# Patient Record
Sex: Female | Born: 1945
Health system: Southern US, Community
[De-identification: ages and names within clinical notes are randomized; demographics above are authoritative.]

## PROBLEM LIST (undated history)

## (undated) DIAGNOSIS — D229 Melanocytic nevi, unspecified: Secondary | ICD-10-CM

## (undated) DIAGNOSIS — D128 Benign neoplasm of rectum: Secondary | ICD-10-CM

## (undated) DIAGNOSIS — I471 Supraventricular tachycardia, unspecified: Secondary | ICD-10-CM

## (undated) DIAGNOSIS — M8580 Other specified disorders of bone density and structure, unspecified site: Secondary | ICD-10-CM

## (undated) DIAGNOSIS — E039 Hypothyroidism, unspecified: Secondary | ICD-10-CM

## (undated) DIAGNOSIS — IMO0002 Reserved for concepts with insufficient information to code with codable children: Secondary | ICD-10-CM

## (undated) HISTORY — PX: OTHER SURGICAL HISTORY: SHX169

## (undated) HISTORY — PX: CERVICAL BIOPSY: SHX590

## (undated) HISTORY — PX: APPENDECTOMY: SHX54

## (undated) HISTORY — DX: Melanocytic nevi, unspecified: D22.9

## (undated) HISTORY — PX: TONSILLECTOMY: SUR1361

## (undated) HISTORY — PX: CHOLECYSTECTOMY: SHX55

---

## 2009-11-25 ENCOUNTER — Ambulatory Visit: Payer: Self-pay | Admitting: Internal Medicine

## 2010-06-20 ENCOUNTER — Ambulatory Visit: Payer: Self-pay | Admitting: Gastroenterology

## 2011-01-09 ENCOUNTER — Ambulatory Visit: Payer: Self-pay | Admitting: Internal Medicine

## 2012-01-18 ENCOUNTER — Ambulatory Visit: Payer: Self-pay | Admitting: Internal Medicine

## 2013-01-21 ENCOUNTER — Ambulatory Visit: Payer: Self-pay | Admitting: Internal Medicine

## 2015-05-19 ENCOUNTER — Other Ambulatory Visit: Payer: Self-pay | Admitting: Internal Medicine

## 2015-05-19 DIAGNOSIS — Z1231 Encounter for screening mammogram for malignant neoplasm of breast: Secondary | ICD-10-CM

## 2015-05-25 ENCOUNTER — Ambulatory Visit
Admission: RE | Admit: 2015-05-25 | Discharge: 2015-05-25 | Disposition: A | Discharge status: Home / Self Care | Payer: PPO | Source: Ambulatory Visit | Attending: Internal Medicine | Admitting: Internal Medicine

## 2015-05-25 DIAGNOSIS — Z1231 Encounter for screening mammogram for malignant neoplasm of breast: Secondary | ICD-10-CM | POA: Diagnosis not present

## 2015-05-28 DIAGNOSIS — Z86010 Personal history of colonic polyps: Secondary | ICD-10-CM | POA: Diagnosis not present

## 2015-06-10 DIAGNOSIS — E063 Autoimmune thyroiditis: Secondary | ICD-10-CM | POA: Diagnosis not present

## 2015-06-10 DIAGNOSIS — Z0000 Encounter for general adult medical examination without abnormal findings: Secondary | ICD-10-CM | POA: Diagnosis not present

## 2015-06-10 DIAGNOSIS — R002 Palpitations: Secondary | ICD-10-CM | POA: Diagnosis not present

## 2015-06-10 DIAGNOSIS — E038 Other specified hypothyroidism: Secondary | ICD-10-CM | POA: Diagnosis not present

## 2015-06-10 DIAGNOSIS — M8580 Other specified disorders of bone density and structure, unspecified site: Secondary | ICD-10-CM | POA: Diagnosis not present

## 2015-06-17 DIAGNOSIS — E039 Hypothyroidism, unspecified: Secondary | ICD-10-CM | POA: Diagnosis not present

## 2015-06-17 DIAGNOSIS — M8580 Other specified disorders of bone density and structure, unspecified site: Secondary | ICD-10-CM | POA: Diagnosis not present

## 2015-06-17 DIAGNOSIS — R002 Palpitations: Secondary | ICD-10-CM | POA: Diagnosis not present

## 2015-06-17 DIAGNOSIS — Z0000 Encounter for general adult medical examination without abnormal findings: Secondary | ICD-10-CM | POA: Diagnosis not present

## 2015-07-08 ENCOUNTER — Encounter: Payer: Self-pay | Admitting: *Deleted

## 2015-07-09 ENCOUNTER — Ambulatory Visit
Admission: RE | Admit: 2015-07-09 | Discharge: 2015-07-09 | Disposition: A | Discharge status: Home / Self Care | Payer: PPO | Source: Ambulatory Visit | Attending: Gastroenterology | Admitting: Gastroenterology

## 2015-07-09 ENCOUNTER — Ambulatory Visit: Payer: PPO | Admitting: Anesthesiology

## 2015-07-09 ENCOUNTER — Encounter: Payer: Self-pay | Admitting: *Deleted

## 2015-07-09 ENCOUNTER — Encounter
Admission: RE | Disposition: A | Discharge status: Home / Self Care | Payer: Self-pay | Source: Ambulatory Visit | Attending: Gastroenterology

## 2015-07-09 DIAGNOSIS — K635 Polyp of colon: Secondary | ICD-10-CM | POA: Diagnosis not present

## 2015-07-09 DIAGNOSIS — Z79899 Other long term (current) drug therapy: Secondary | ICD-10-CM | POA: Diagnosis not present

## 2015-07-09 DIAGNOSIS — D122 Benign neoplasm of ascending colon: Secondary | ICD-10-CM | POA: Insufficient documentation

## 2015-07-09 DIAGNOSIS — K621 Rectal polyp: Secondary | ICD-10-CM | POA: Diagnosis not present

## 2015-07-09 DIAGNOSIS — M8580 Other specified disorders of bone density and structure, unspecified site: Secondary | ICD-10-CM | POA: Diagnosis not present

## 2015-07-09 DIAGNOSIS — Z881 Allergy status to other antibiotic agents status: Secondary | ICD-10-CM | POA: Diagnosis not present

## 2015-07-09 DIAGNOSIS — E039 Hypothyroidism, unspecified: Secondary | ICD-10-CM | POA: Insufficient documentation

## 2015-07-09 DIAGNOSIS — I471 Supraventricular tachycardia: Secondary | ICD-10-CM | POA: Diagnosis not present

## 2015-07-09 DIAGNOSIS — D128 Benign neoplasm of rectum: Secondary | ICD-10-CM | POA: Diagnosis not present

## 2015-07-09 DIAGNOSIS — Z86010 Personal history of colonic polyps: Secondary | ICD-10-CM | POA: Diagnosis not present

## 2015-07-09 DIAGNOSIS — Z1211 Encounter for screening for malignant neoplasm of colon: Secondary | ICD-10-CM | POA: Diagnosis not present

## 2015-07-09 DIAGNOSIS — K5790 Diverticulosis of intestine, part unspecified, without perforation or abscess without bleeding: Secondary | ICD-10-CM | POA: Diagnosis not present

## 2015-07-09 DIAGNOSIS — K5730 Diverticulosis of large intestine without perforation or abscess without bleeding: Secondary | ICD-10-CM | POA: Diagnosis not present

## 2015-07-09 DIAGNOSIS — Z888 Allergy status to other drugs, medicaments and biological substances status: Secondary | ICD-10-CM | POA: Diagnosis not present

## 2015-07-09 HISTORY — DX: Other specified disorders of bone density and structure, unspecified site: M85.80

## 2015-07-09 HISTORY — PX: COLONOSCOPY WITH PROPOFOL: SHX5780

## 2015-07-09 HISTORY — DX: Benign neoplasm of rectum: D12.8

## 2015-07-09 HISTORY — DX: Supraventricular tachycardia: I47.1

## 2015-07-09 HISTORY — DX: Reserved for concepts with insufficient information to code with codable children: IMO0002

## 2015-07-09 HISTORY — DX: Hypothyroidism, unspecified: E03.9

## 2015-07-09 HISTORY — DX: Supraventricular tachycardia, unspecified: I47.10

## 2015-07-09 SURGERY — COLONOSCOPY WITH PROPOFOL
Anesthesia: General

## 2015-07-09 MED ORDER — EPHEDRINE SULFATE 50 MG/ML IJ SOLN
INTRAMUSCULAR | Status: DC | PRN
  Administered 2015-07-09 (×2): 10 mg via INTRAVENOUS
  Administered 2015-07-09: 5 mg via INTRAVENOUS

## 2015-07-09 MED ORDER — SODIUM CHLORIDE 0.9 % IV SOLN: INTRAVENOUS | Status: DC

## 2015-07-09 MED ORDER — SODIUM CHLORIDE 0.9 % IV SOLN
INTRAVENOUS | Status: DC
  Administered 2015-07-09: 1000 mL via INTRAVENOUS

## 2015-07-09 MED ORDER — MIDAZOLAM HCL 5 MG/5ML IJ SOLN
INTRAMUSCULAR | Status: DC | PRN
  Administered 2015-07-09: 1 mg via INTRAVENOUS

## 2015-07-09 MED ORDER — FENTANYL CITRATE (PF) 100 MCG/2ML IJ SOLN
INTRAMUSCULAR | Status: DC | PRN
Start: 2015-07-09 — End: 2015-07-09
  Administered 2015-07-09: 50 ug via INTRAVENOUS

## 2015-07-09 MED ORDER — PROPOFOL 500 MG/50ML IV EMUL
INTRAVENOUS | Status: DC | PRN
  Administered 2015-07-09: 140 ug/kg/min via INTRAVENOUS

## 2015-07-09 MED ORDER — PROPOFOL 10 MG/ML IV BOLUS
INTRAVENOUS | Status: DC | PRN
  Administered 2015-07-09: 25 mg via INTRAVENOUS

## 2015-07-09 NOTE — Anesthesia Postprocedure Evaluation (Signed)
Anesthesia Post Note  Patient: Patricia Clay  Procedure(s) Performed: Procedure(s) (LRB): COLONOSCOPY WITH PROPOFOL (N/A)  Patient location during evaluation: PACU Anesthesia Type: General Level of consciousness: awake Pain management: pain level controlled Vital Signs Assessment: post-procedure vital signs reviewed and stable Respiratory status: spontaneous breathing Cardiovascular status: blood pressure returned to baseline Anesthetic complications: no    Last Vitals:  Filed Vitals:   07/09/15 1125 07/09/15 1135  BP: 97/64 100/73  Pulse: 72 70  Temp:    Resp: 13 17    Last Pain: There were no vitals filed for this visit.               VAN STAVEREN,Amirra Herling

## 2015-07-09 NOTE — OR Nursing (Signed)
Poor Prep 

## 2015-07-09 NOTE — Op Note (Signed)
[facility] Gastroenterology Patient Name: Patricia Clay Procedure Date: 07/09/2015 10:22 AM MRN: [id number] Account #: [id number] Date of Birth: May 07, 1946 Admit Type: Outpatient Age: 70 Room: [facility] ENDO ROOM 3 Gender: Female Note Status: Finalized Procedure:            Colonoscopy Indications:          Personal history of colonic polyps Providers:            Lollie Sails, MD Referring MD:         Tracie Harrier, MD (Referring MD) Medicines:            Monitored Anesthesia Care Complications:        No immediate complications. Procedure:            Pre-Anesthesia Assessment:                       - ASA Grade Assessment: II - A patient with mild                        systemic disease.                       After obtaining informed consent, the colonoscope was                        passed under direct vision. Throughout the procedure,                        the patient's blood pressure, pulse, and oxygen                        saturations were monitored continuously. The                        Colonoscope was introduced through the anus and                        advanced to the the cecum, identified by appendiceal                        orifice and ileocecal valve. The colonoscopy was                        performed with moderate difficulty due to poor bowel                        prep. Successful completion of the procedure was aided                        by lavage. The patient tolerated the procedure well. Findings:      Multiple small-mouthed diverticula were found in the sigmoid colon and       descending colon.      A 2 mm polyp was found in the ascending colon. The polyp was flat. The       polyp was removed with a cold biopsy forceps. Resection and retrieval       were complete.      A 3 mm polyp was found in the rectum. The polyp was sessile. The polyp       was removed with a cold biopsy forceps. Resection and retrieval  were        complete.      The digital rectal exam was normal. Note several small exterior skin       tags, no inflammation. Impression:           - Diverticulosis in the sigmoid colon and in the                        descending colon.                       - One 2 mm polyp in the ascending colon, removed with a                        cold biopsy forceps. Resected and retrieved.                       - One 3 mm polyp in the rectum, removed with a cold                        biopsy forceps. Resected and retrieved. Recommendation:       - Discharge patient to home.                       - Await pathology results.                       - Telephone GI clinic for pathology results in 1 week. Procedure Code(s):    --- Professional ---                       [phone], Colonoscopy, flexible; with biopsy, single or                        multiple Diagnosis Code(s):    --- Professional ---                       D12.2, Benign neoplasm of ascending colon                       K62.1, Rectal polyp                       Z86.010, Personal history of colonic polyps                       K57.30, Diverticulosis of large intestine without                        perforation or abscess without bleeding CPT copyright 2016 American Medical Association. All rights reserved. The codes documented in this report are preliminary and upon coder review may  be revised to meet current compliance requirements. Lollie Sails, MD 07/09/2015 11:04:01 AM This report has been signed electronically. Number of Addenda: 0 Note Initiated On: 07/09/2015 10:22 AM Scope Withdrawal Time: 0 hours 12 minutes 58 seconds  Total Procedure Duration: 0 hours 26 minutes 17 seconds       [facility]

## 2015-07-09 NOTE — Anesthesia Preprocedure Evaluation (Signed)
Anesthesia Evaluation  Patient identified by MRN, date of birth, ID band Patient awake    Reviewed: Allergy & Precautions, Patient's Chart, lab work & pertinent test results  Airway Mallampati: II       Dental  (+) Teeth Intact   Pulmonary former smoker,    breath sounds clear to auscultation       Cardiovascular Exercise Tolerance: Good  Rhythm:Regular     Neuro/Psych    GI/Hepatic negative GI ROS, Neg liver ROS,   Endo/Other  Hypothyroidism   Renal/GU      Musculoskeletal negative musculoskeletal ROS (+)   Abdominal   Peds  Hematology   Anesthesia Other Findings   Reproductive/Obstetrics                             Anesthesia Physical Anesthesia Plan  ASA: II  Anesthesia Plan: General   Post-op Pain Management:    Induction: Intravenous  Airway Management Planned: Natural Airway and Nasal Cannula  Additional Equipment:   Intra-op Plan:   Post-operative Plan:   Informed Consent: I have reviewed the patients History and Physical, chart, labs and discussed the procedure including the risks, benefits and alternatives for the proposed anesthesia with the patient or authorized representative who has indicated his/her understanding and acceptance.     Plan Discussed with: CRNA  Anesthesia Plan Comments:         Anesthesia Quick Evaluation

## 2015-07-09 NOTE — Transfer of Care (Signed)
Immediate Anesthesia Transfer of Care Note  Patient: Patricia Clay  Procedure(s) Performed: Procedure(s): COLONOSCOPY WITH PROPOFOL (N/A)  Patient Location: PACU and Endoscopy Unit  Anesthesia Type:General  Level of Consciousness: awake, alert  and oriented  Airway & Oxygen Therapy: Patient Spontanous Breathing and Patient connected to nasal cannula oxygen  Post-op Assessment: Report given to RN and Post -op Vital signs reviewed and stable  Post vital signs: Reviewed and stable  Last Vitals:  Filed Vitals:   07/09/15 0929 07/09/15 1105  BP: 106/73 87/58  Pulse: 71 77  Temp: 36.7 C 35.6 C  Resp: 18 14    Complications: No apparent anesthesia complications

## 2015-07-09 NOTE — Anesthesia Procedure Notes (Signed)
Date/Time: 07/09/2015 10:27 AM Performed by: Kennon Holter Pre-anesthesia Checklist: Timeout performed, Suction available, Patient being monitored, Emergency Drugs available and Patient identified Patient Re-evaluated:Patient Re-evaluated prior to inductionPreoxygenation: Pre-oxygenation with 100% oxygen Intubation Type: IV induction

## 2015-07-09 NOTE — H&P (Signed)
Outpatient short stay form Pre-procedure 07/09/2015 10:20 AM Patricia Sails MD  Primary Physician: Dr Tracie Harrier  Reason for visit:  Colonoscopy  History of present illness:  Patient is a 70 year old Caucasian female presenting today for follow-up colonoscopy. Her last colonoscopy was in 2012 the finding of 2 tubular adenomas. She had a episode of diverticulitis in early [id number] that was uncomplicated. He is presenting today for follow-up colonoscopy. He tolerated her prep well. She takes no blood thinning agents for aspirin products.    Current facility-administered medications:  .  0.9 %  sodium chloride infusion, , Intravenous, Continuous, Patricia Sails, MD, Last Rate: 20 mL/hr at 07/09/15 0950, 1,000 mL at 07/09/15 0950 .  0.9 %  sodium chloride infusion, , Intravenous, Continuous, Patricia Sails, MD  Prescriptions prior to admission  Medication Sig Dispense Refill Last Dose  . ascorbic acid (VITAMIN C) 1000 MG tablet Take 1,000 mg by mouth daily.     . Boron 3 MG CAPS Take by mouth.     . calcium carbonate (OSCAL) 1500 (600 Ca) MG TABS tablet Take by mouth 2 (two) times daily with a meal.     . Digestive Enzymes (BETAINE HCL PO) Take 600 mg by mouth.     . estradiol (ESTRACE) 0.1 MG/GM vaginal cream Place 1 Applicatorful vaginally at bedtime.     Marland Kitchen EVENING PRIMROSE OIL PO Take by mouth.     Maple Mirza (FLORA-Q) CAPS capsule Take 1 capsule by mouth daily.     Marland Kitchen levothyroxine (SYNTHROID, LEVOTHROID) 100 MCG tablet Take 100 mcg by mouth daily before breakfast.     . liothyronine (CYTOMEL) 5 MCG tablet Take 5 mcg by mouth daily.     . magnesium oxide (MAG-OX) 400 MG tablet Take 400 mg by mouth daily.     . Multiple Vitamins-Minerals (MULTIVITAMIN WITH MINERALS) tablet Take 1 tablet by mouth daily.     . Omega-3 Fatty Acids (FISH OIL) 1200 MG CAPS Take by mouth.     . Turmeric 500 MG CAPS Take by mouth.     . verapamil (CALAN-SR) 120 MG CR tablet Take 120 mg by mouth at  bedtime.   07/09/2015 at 0600     Allergies  Allergen Reactions  . Augmentin [Amoxicillin-Pot Clavulanate]   . Ciprocin-Fluocin-Procin [Fluocinolone]   . Flagyl [Metronidazole]   . Primaxin [Imipenem]      Past Medical History  Diagnosis Date  . Hypothyroidism   . LGSIL (low grade squamous intraepithelial dysplasia)   . Osteopenia   . Paroxysmal SVT (supraventricular tachycardia) ([place])   . Tubular adenoma polyp of rectum     Review of systems:      Physical Exam    Heart and lungs: Regular rate and rhythm without rub or gallop, lungs are bilaterally clear.    HEENT: Normocephalic atraumatic eyes are anicteric    Other:     Pertinant exam for procedure: Soft nontender nondistended bowel sounds positive normoactive.    Planned proceedures: Colonoscopy and indicated procedures. I have discussed the risks benefits and complications of procedures to include not limited to bleeding, infection, perforation and the risk of sedation and the patient wishes to proceed.    Patricia Sails, MD Gastroenterology 07/09/2015  10:20 AM

## 2015-07-12 LAB — SURGICAL PATHOLOGY

## 2015-07-13 ENCOUNTER — Encounter: Payer: Self-pay | Admitting: Gastroenterology

## 2015-09-29 DIAGNOSIS — H2513 Age-related nuclear cataract, bilateral: Secondary | ICD-10-CM | POA: Diagnosis not present

## 2015-10-05 DIAGNOSIS — L578 Other skin changes due to chronic exposure to nonionizing radiation: Secondary | ICD-10-CM | POA: Diagnosis not present

## 2015-10-05 DIAGNOSIS — Z1283 Encounter for screening for malignant neoplasm of skin: Secondary | ICD-10-CM | POA: Diagnosis not present

## 2015-10-05 DIAGNOSIS — D229 Melanocytic nevi, unspecified: Secondary | ICD-10-CM | POA: Diagnosis not present

## 2015-10-05 DIAGNOSIS — L820 Inflamed seborrheic keratosis: Secondary | ICD-10-CM | POA: Diagnosis not present

## 2015-10-05 DIAGNOSIS — L720 Epidermal cyst: Secondary | ICD-10-CM | POA: Diagnosis not present

## 2015-10-05 DIAGNOSIS — D1800 Hemangioma unspecified site: Secondary | ICD-10-CM | POA: Diagnosis not present

## 2015-10-05 DIAGNOSIS — L308 Other specified dermatitis: Secondary | ICD-10-CM | POA: Diagnosis not present

## 2015-10-05 DIAGNOSIS — L821 Other seborrheic keratosis: Secondary | ICD-10-CM | POA: Diagnosis not present

## 2015-10-21 DIAGNOSIS — R002 Palpitations: Secondary | ICD-10-CM | POA: Diagnosis not present

## 2015-10-21 DIAGNOSIS — R6889 Other general symptoms and signs: Secondary | ICD-10-CM | POA: Diagnosis not present

## 2015-10-21 DIAGNOSIS — R109 Unspecified abdominal pain: Secondary | ICD-10-CM | POA: Diagnosis not present

## 2015-10-21 DIAGNOSIS — K5909 Other constipation: Secondary | ICD-10-CM | POA: Diagnosis not present

## 2015-11-08 DIAGNOSIS — Z23 Encounter for immunization: Secondary | ICD-10-CM | POA: Diagnosis not present

## 2015-11-09 DIAGNOSIS — H02403 Unspecified ptosis of bilateral eyelids: Secondary | ICD-10-CM | POA: Diagnosis not present

## 2015-11-16 DIAGNOSIS — H02831 Dermatochalasis of right upper eyelid: Secondary | ICD-10-CM | POA: Diagnosis not present

## 2015-12-10 DIAGNOSIS — Z01818 Encounter for other preprocedural examination: Secondary | ICD-10-CM | POA: Diagnosis not present

## 2015-12-10 DIAGNOSIS — H02409 Unspecified ptosis of unspecified eyelid: Secondary | ICD-10-CM | POA: Diagnosis not present

## 2015-12-15 DIAGNOSIS — M8580 Other specified disorders of bone density and structure, unspecified site: Secondary | ICD-10-CM | POA: Diagnosis not present

## 2015-12-15 DIAGNOSIS — E039 Hypothyroidism, unspecified: Secondary | ICD-10-CM | POA: Diagnosis not present

## 2015-12-15 DIAGNOSIS — R002 Palpitations: Secondary | ICD-10-CM | POA: Diagnosis not present

## 2015-12-15 DIAGNOSIS — Z0000 Encounter for general adult medical examination without abnormal findings: Secondary | ICD-10-CM | POA: Diagnosis not present

## 2016-01-07 DIAGNOSIS — H02831 Dermatochalasis of right upper eyelid: Secondary | ICD-10-CM | POA: Diagnosis not present

## 2016-01-07 DIAGNOSIS — Z79899 Other long term (current) drug therapy: Secondary | ICD-10-CM | POA: Diagnosis not present

## 2016-01-07 DIAGNOSIS — H02834 Dermatochalasis of left upper eyelid: Secondary | ICD-10-CM | POA: Diagnosis not present

## 2016-01-07 DIAGNOSIS — H02401 Unspecified ptosis of right eyelid: Secondary | ICD-10-CM | POA: Diagnosis not present

## 2016-03-07 ENCOUNTER — Other Ambulatory Visit: Payer: Self-pay | Admitting: Obstetrics and Gynecology

## 2016-03-07 DIAGNOSIS — Z01419 Encounter for gynecological examination (general) (routine) without abnormal findings: Secondary | ICD-10-CM | POA: Diagnosis not present

## 2016-03-07 DIAGNOSIS — Z124 Encounter for screening for malignant neoplasm of cervix: Secondary | ICD-10-CM | POA: Diagnosis not present

## 2016-03-07 DIAGNOSIS — Z1231 Encounter for screening mammogram for malignant neoplasm of breast: Secondary | ICD-10-CM

## 2016-03-07 DIAGNOSIS — Z1211 Encounter for screening for malignant neoplasm of colon: Secondary | ICD-10-CM | POA: Diagnosis not present

## 2016-03-21 DIAGNOSIS — M8580 Other specified disorders of bone density and structure, unspecified site: Secondary | ICD-10-CM | POA: Diagnosis not present

## 2016-05-25 ENCOUNTER — Ambulatory Visit
Admission: RE | Admit: 2016-05-25 | Discharge: 2016-05-25 | Disposition: A | Discharge status: Home / Self Care | Payer: PPO | Source: Ambulatory Visit | Attending: Obstetrics and Gynecology | Admitting: Obstetrics and Gynecology

## 2016-05-25 DIAGNOSIS — Z1231 Encounter for screening mammogram for malignant neoplasm of breast: Secondary | ICD-10-CM | POA: Diagnosis not present

## 2016-06-12 DIAGNOSIS — Z131 Encounter for screening for diabetes mellitus: Secondary | ICD-10-CM | POA: Diagnosis not present

## 2016-06-12 DIAGNOSIS — E039 Hypothyroidism, unspecified: Secondary | ICD-10-CM | POA: Diagnosis not present

## 2016-06-12 DIAGNOSIS — R002 Palpitations: Secondary | ICD-10-CM | POA: Diagnosis not present

## 2016-06-12 DIAGNOSIS — M8580 Other specified disorders of bone density and structure, unspecified site: Secondary | ICD-10-CM | POA: Diagnosis not present

## 2016-06-12 DIAGNOSIS — Z0000 Encounter for general adult medical examination without abnormal findings: Secondary | ICD-10-CM | POA: Diagnosis not present

## 2016-06-19 DIAGNOSIS — R002 Palpitations: Secondary | ICD-10-CM | POA: Diagnosis not present

## 2016-06-19 DIAGNOSIS — Z0000 Encounter for general adult medical examination without abnormal findings: Secondary | ICD-10-CM | POA: Diagnosis not present

## 2016-06-19 DIAGNOSIS — E039 Hypothyroidism, unspecified: Secondary | ICD-10-CM | POA: Diagnosis not present

## 2016-06-28 DIAGNOSIS — H02831 Dermatochalasis of right upper eyelid: Secondary | ICD-10-CM | POA: Diagnosis not present

## 2016-09-06 DIAGNOSIS — H02403 Unspecified ptosis of bilateral eyelids: Secondary | ICD-10-CM | POA: Diagnosis not present

## 2016-09-06 DIAGNOSIS — H02401 Unspecified ptosis of right eyelid: Secondary | ICD-10-CM | POA: Diagnosis not present

## 2016-12-20 DIAGNOSIS — H02211 Cicatricial lagophthalmos right upper eyelid: Secondary | ICD-10-CM | POA: Diagnosis not present

## 2017-02-22 DIAGNOSIS — H2512 Age-related nuclear cataract, left eye: Secondary | ICD-10-CM | POA: Diagnosis not present

## 2017-03-05 DIAGNOSIS — E559 Vitamin D deficiency, unspecified: Secondary | ICD-10-CM | POA: Diagnosis not present

## 2017-03-05 DIAGNOSIS — E039 Hypothyroidism, unspecified: Secondary | ICD-10-CM | POA: Diagnosis not present

## 2017-05-08 ENCOUNTER — Other Ambulatory Visit: Payer: Self-pay | Admitting: Internal Medicine

## 2017-05-08 DIAGNOSIS — Z1231 Encounter for screening mammogram for malignant neoplasm of breast: Secondary | ICD-10-CM

## 2017-05-23 DIAGNOSIS — L853 Xerosis cutis: Secondary | ICD-10-CM | POA: Diagnosis not present

## 2017-05-23 DIAGNOSIS — L821 Other seborrheic keratosis: Secondary | ICD-10-CM | POA: Diagnosis not present

## 2017-05-23 DIAGNOSIS — L820 Inflamed seborrheic keratosis: Secondary | ICD-10-CM | POA: Diagnosis not present

## 2017-05-23 DIAGNOSIS — D1800 Hemangioma unspecified site: Secondary | ICD-10-CM | POA: Diagnosis not present

## 2017-05-23 DIAGNOSIS — D229 Melanocytic nevi, unspecified: Secondary | ICD-10-CM | POA: Diagnosis not present

## 2017-05-23 DIAGNOSIS — Z1283 Encounter for screening for malignant neoplasm of skin: Secondary | ICD-10-CM | POA: Diagnosis not present

## 2017-06-01 ENCOUNTER — Ambulatory Visit
Admission: RE | Admit: 2017-06-01 | Discharge: 2017-06-01 | Disposition: A | Discharge status: Home / Self Care | Payer: PPO | Source: Ambulatory Visit | Attending: Internal Medicine | Admitting: Internal Medicine

## 2017-06-01 DIAGNOSIS — Z1231 Encounter for screening mammogram for malignant neoplasm of breast: Secondary | ICD-10-CM | POA: Insufficient documentation

## 2017-06-13 DIAGNOSIS — Z0000 Encounter for general adult medical examination without abnormal findings: Secondary | ICD-10-CM | POA: Diagnosis not present

## 2017-06-13 DIAGNOSIS — R002 Palpitations: Secondary | ICD-10-CM | POA: Diagnosis not present

## 2017-06-13 DIAGNOSIS — R739 Hyperglycemia, unspecified: Secondary | ICD-10-CM | POA: Diagnosis not present

## 2017-06-13 DIAGNOSIS — E039 Hypothyroidism, unspecified: Secondary | ICD-10-CM | POA: Diagnosis not present

## 2017-06-20 DIAGNOSIS — M8580 Other specified disorders of bone density and structure, unspecified site: Secondary | ICD-10-CM | POA: Diagnosis not present

## 2017-06-20 DIAGNOSIS — E039 Hypothyroidism, unspecified: Secondary | ICD-10-CM | POA: Diagnosis not present

## 2017-06-20 DIAGNOSIS — Z0000 Encounter for general adult medical examination without abnormal findings: Secondary | ICD-10-CM | POA: Diagnosis not present

## 2017-06-20 DIAGNOSIS — Z23 Encounter for immunization: Secondary | ICD-10-CM | POA: Diagnosis not present

## 2017-06-20 DIAGNOSIS — R002 Palpitations: Secondary | ICD-10-CM | POA: Diagnosis not present

## 2017-07-16 DIAGNOSIS — L821 Other seborrheic keratosis: Secondary | ICD-10-CM | POA: Diagnosis not present

## 2017-07-16 DIAGNOSIS — L820 Inflamed seborrheic keratosis: Secondary | ICD-10-CM | POA: Diagnosis not present

## 2017-12-18 DIAGNOSIS — E039 Hypothyroidism, unspecified: Secondary | ICD-10-CM | POA: Diagnosis not present

## 2018-02-12 DIAGNOSIS — M9903 Segmental and somatic dysfunction of lumbar region: Secondary | ICD-10-CM | POA: Diagnosis not present

## 2018-02-12 DIAGNOSIS — M5137 Other intervertebral disc degeneration, lumbosacral region: Secondary | ICD-10-CM | POA: Diagnosis not present

## 2018-02-12 DIAGNOSIS — M531 Cervicobrachial syndrome: Secondary | ICD-10-CM | POA: Diagnosis not present

## 2018-02-12 DIAGNOSIS — M546 Pain in thoracic spine: Secondary | ICD-10-CM | POA: Diagnosis not present

## 2018-02-12 DIAGNOSIS — M9902 Segmental and somatic dysfunction of thoracic region: Secondary | ICD-10-CM | POA: Diagnosis not present

## 2018-02-12 DIAGNOSIS — M9901 Segmental and somatic dysfunction of cervical region: Secondary | ICD-10-CM | POA: Diagnosis not present

## 2018-02-13 DIAGNOSIS — M5137 Other intervertebral disc degeneration, lumbosacral region: Secondary | ICD-10-CM | POA: Diagnosis not present

## 2018-02-13 DIAGNOSIS — M546 Pain in thoracic spine: Secondary | ICD-10-CM | POA: Diagnosis not present

## 2018-02-13 DIAGNOSIS — M9903 Segmental and somatic dysfunction of lumbar region: Secondary | ICD-10-CM | POA: Diagnosis not present

## 2018-02-13 DIAGNOSIS — M9902 Segmental and somatic dysfunction of thoracic region: Secondary | ICD-10-CM | POA: Diagnosis not present

## 2018-02-13 DIAGNOSIS — M531 Cervicobrachial syndrome: Secondary | ICD-10-CM | POA: Diagnosis not present

## 2018-02-13 DIAGNOSIS — M9901 Segmental and somatic dysfunction of cervical region: Secondary | ICD-10-CM | POA: Diagnosis not present

## 2018-03-13 DIAGNOSIS — M9901 Segmental and somatic dysfunction of cervical region: Secondary | ICD-10-CM | POA: Diagnosis not present

## 2018-03-13 DIAGNOSIS — M531 Cervicobrachial syndrome: Secondary | ICD-10-CM | POA: Diagnosis not present

## 2018-03-13 DIAGNOSIS — M546 Pain in thoracic spine: Secondary | ICD-10-CM | POA: Diagnosis not present

## 2018-03-13 DIAGNOSIS — M9902 Segmental and somatic dysfunction of thoracic region: Secondary | ICD-10-CM | POA: Diagnosis not present

## 2018-03-13 DIAGNOSIS — M5137 Other intervertebral disc degeneration, lumbosacral region: Secondary | ICD-10-CM | POA: Diagnosis not present

## 2018-03-13 DIAGNOSIS — M9903 Segmental and somatic dysfunction of lumbar region: Secondary | ICD-10-CM | POA: Diagnosis not present

## 2018-03-15 DIAGNOSIS — M5137 Other intervertebral disc degeneration, lumbosacral region: Secondary | ICD-10-CM | POA: Diagnosis not present

## 2018-03-15 DIAGNOSIS — M546 Pain in thoracic spine: Secondary | ICD-10-CM | POA: Diagnosis not present

## 2018-03-15 DIAGNOSIS — M9901 Segmental and somatic dysfunction of cervical region: Secondary | ICD-10-CM | POA: Diagnosis not present

## 2018-03-15 DIAGNOSIS — M9902 Segmental and somatic dysfunction of thoracic region: Secondary | ICD-10-CM | POA: Diagnosis not present

## 2018-03-15 DIAGNOSIS — M9903 Segmental and somatic dysfunction of lumbar region: Secondary | ICD-10-CM | POA: Diagnosis not present

## 2018-03-15 DIAGNOSIS — M531 Cervicobrachial syndrome: Secondary | ICD-10-CM | POA: Diagnosis not present

## 2018-03-22 DIAGNOSIS — M531 Cervicobrachial syndrome: Secondary | ICD-10-CM | POA: Diagnosis not present

## 2018-03-22 DIAGNOSIS — M9902 Segmental and somatic dysfunction of thoracic region: Secondary | ICD-10-CM | POA: Diagnosis not present

## 2018-03-22 DIAGNOSIS — M9901 Segmental and somatic dysfunction of cervical region: Secondary | ICD-10-CM | POA: Diagnosis not present

## 2018-03-22 DIAGNOSIS — M546 Pain in thoracic spine: Secondary | ICD-10-CM | POA: Diagnosis not present

## 2018-03-22 DIAGNOSIS — M9903 Segmental and somatic dysfunction of lumbar region: Secondary | ICD-10-CM | POA: Diagnosis not present

## 2018-03-22 DIAGNOSIS — M5137 Other intervertebral disc degeneration, lumbosacral region: Secondary | ICD-10-CM | POA: Diagnosis not present

## 2018-03-27 DIAGNOSIS — M546 Pain in thoracic spine: Secondary | ICD-10-CM | POA: Diagnosis not present

## 2018-03-27 DIAGNOSIS — M8588 Other specified disorders of bone density and structure, other site: Secondary | ICD-10-CM | POA: Diagnosis not present

## 2018-03-27 DIAGNOSIS — M9901 Segmental and somatic dysfunction of cervical region: Secondary | ICD-10-CM | POA: Diagnosis not present

## 2018-03-27 DIAGNOSIS — M5137 Other intervertebral disc degeneration, lumbosacral region: Secondary | ICD-10-CM | POA: Diagnosis not present

## 2018-03-27 DIAGNOSIS — M531 Cervicobrachial syndrome: Secondary | ICD-10-CM | POA: Diagnosis not present

## 2018-03-27 DIAGNOSIS — M9903 Segmental and somatic dysfunction of lumbar region: Secondary | ICD-10-CM | POA: Diagnosis not present

## 2018-03-27 DIAGNOSIS — M9902 Segmental and somatic dysfunction of thoracic region: Secondary | ICD-10-CM | POA: Diagnosis not present

## 2018-04-04 DIAGNOSIS — M531 Cervicobrachial syndrome: Secondary | ICD-10-CM | POA: Diagnosis not present

## 2018-04-04 DIAGNOSIS — M546 Pain in thoracic spine: Secondary | ICD-10-CM | POA: Diagnosis not present

## 2018-04-04 DIAGNOSIS — M5137 Other intervertebral disc degeneration, lumbosacral region: Secondary | ICD-10-CM | POA: Diagnosis not present

## 2018-04-04 DIAGNOSIS — M9903 Segmental and somatic dysfunction of lumbar region: Secondary | ICD-10-CM | POA: Diagnosis not present

## 2018-04-04 DIAGNOSIS — M9901 Segmental and somatic dysfunction of cervical region: Secondary | ICD-10-CM | POA: Diagnosis not present

## 2018-04-04 DIAGNOSIS — M9902 Segmental and somatic dysfunction of thoracic region: Secondary | ICD-10-CM | POA: Diagnosis not present

## 2018-04-11 DIAGNOSIS — M9901 Segmental and somatic dysfunction of cervical region: Secondary | ICD-10-CM | POA: Diagnosis not present

## 2018-04-11 DIAGNOSIS — M9902 Segmental and somatic dysfunction of thoracic region: Secondary | ICD-10-CM | POA: Diagnosis not present

## 2018-04-11 DIAGNOSIS — M546 Pain in thoracic spine: Secondary | ICD-10-CM | POA: Diagnosis not present

## 2018-04-11 DIAGNOSIS — M531 Cervicobrachial syndrome: Secondary | ICD-10-CM | POA: Diagnosis not present

## 2018-04-11 DIAGNOSIS — M5137 Other intervertebral disc degeneration, lumbosacral region: Secondary | ICD-10-CM | POA: Diagnosis not present

## 2018-04-11 DIAGNOSIS — M9903 Segmental and somatic dysfunction of lumbar region: Secondary | ICD-10-CM | POA: Diagnosis not present

## 2018-04-17 DIAGNOSIS — M9903 Segmental and somatic dysfunction of lumbar region: Secondary | ICD-10-CM | POA: Diagnosis not present

## 2018-04-17 DIAGNOSIS — M9902 Segmental and somatic dysfunction of thoracic region: Secondary | ICD-10-CM | POA: Diagnosis not present

## 2018-04-17 DIAGNOSIS — M9901 Segmental and somatic dysfunction of cervical region: Secondary | ICD-10-CM | POA: Diagnosis not present

## 2018-04-17 DIAGNOSIS — M5137 Other intervertebral disc degeneration, lumbosacral region: Secondary | ICD-10-CM | POA: Diagnosis not present

## 2018-04-17 DIAGNOSIS — M531 Cervicobrachial syndrome: Secondary | ICD-10-CM | POA: Diagnosis not present

## 2018-04-17 DIAGNOSIS — M546 Pain in thoracic spine: Secondary | ICD-10-CM | POA: Diagnosis not present

## 2018-04-24 DIAGNOSIS — M9903 Segmental and somatic dysfunction of lumbar region: Secondary | ICD-10-CM | POA: Diagnosis not present

## 2018-04-24 DIAGNOSIS — M5137 Other intervertebral disc degeneration, lumbosacral region: Secondary | ICD-10-CM | POA: Diagnosis not present

## 2018-04-24 DIAGNOSIS — M546 Pain in thoracic spine: Secondary | ICD-10-CM | POA: Diagnosis not present

## 2018-04-24 DIAGNOSIS — M531 Cervicobrachial syndrome: Secondary | ICD-10-CM | POA: Diagnosis not present

## 2018-04-24 DIAGNOSIS — M9902 Segmental and somatic dysfunction of thoracic region: Secondary | ICD-10-CM | POA: Diagnosis not present

## 2018-04-24 DIAGNOSIS — M9901 Segmental and somatic dysfunction of cervical region: Secondary | ICD-10-CM | POA: Diagnosis not present

## 2018-05-03 ENCOUNTER — Other Ambulatory Visit: Payer: Self-pay | Admitting: Internal Medicine

## 2018-05-06 ENCOUNTER — Other Ambulatory Visit: Payer: Self-pay | Admitting: Internal Medicine

## 2018-05-06 DIAGNOSIS — Z1231 Encounter for screening mammogram for malignant neoplasm of breast: Secondary | ICD-10-CM

## 2018-05-09 DIAGNOSIS — M9902 Segmental and somatic dysfunction of thoracic region: Secondary | ICD-10-CM | POA: Diagnosis not present

## 2018-05-09 DIAGNOSIS — M531 Cervicobrachial syndrome: Secondary | ICD-10-CM | POA: Diagnosis not present

## 2018-05-09 DIAGNOSIS — M5137 Other intervertebral disc degeneration, lumbosacral region: Secondary | ICD-10-CM | POA: Diagnosis not present

## 2018-05-09 DIAGNOSIS — M9903 Segmental and somatic dysfunction of lumbar region: Secondary | ICD-10-CM | POA: Diagnosis not present

## 2018-05-09 DIAGNOSIS — M546 Pain in thoracic spine: Secondary | ICD-10-CM | POA: Diagnosis not present

## 2018-05-09 DIAGNOSIS — M9901 Segmental and somatic dysfunction of cervical region: Secondary | ICD-10-CM | POA: Diagnosis not present

## 2018-05-17 DIAGNOSIS — H2513 Age-related nuclear cataract, bilateral: Secondary | ICD-10-CM | POA: Diagnosis not present

## 2018-05-27 DIAGNOSIS — Z1283 Encounter for screening for malignant neoplasm of skin: Secondary | ICD-10-CM | POA: Diagnosis not present

## 2018-05-27 DIAGNOSIS — D1800 Hemangioma unspecified site: Secondary | ICD-10-CM | POA: Diagnosis not present

## 2018-05-27 DIAGNOSIS — D2239 Melanocytic nevi of other parts of face: Secondary | ICD-10-CM | POA: Diagnosis not present

## 2018-05-27 DIAGNOSIS — L720 Epidermal cyst: Secondary | ICD-10-CM | POA: Diagnosis not present

## 2018-05-27 DIAGNOSIS — L812 Freckles: Secondary | ICD-10-CM | POA: Diagnosis not present

## 2018-05-27 DIAGNOSIS — D229 Melanocytic nevi, unspecified: Secondary | ICD-10-CM | POA: Diagnosis not present

## 2018-05-27 DIAGNOSIS — L821 Other seborrheic keratosis: Secondary | ICD-10-CM | POA: Diagnosis not present

## 2018-06-03 DIAGNOSIS — M9903 Segmental and somatic dysfunction of lumbar region: Secondary | ICD-10-CM | POA: Diagnosis not present

## 2018-06-03 DIAGNOSIS — M546 Pain in thoracic spine: Secondary | ICD-10-CM | POA: Diagnosis not present

## 2018-06-03 DIAGNOSIS — M9902 Segmental and somatic dysfunction of thoracic region: Secondary | ICD-10-CM | POA: Diagnosis not present

## 2018-06-03 DIAGNOSIS — M9901 Segmental and somatic dysfunction of cervical region: Secondary | ICD-10-CM | POA: Diagnosis not present

## 2018-06-03 DIAGNOSIS — M5137 Other intervertebral disc degeneration, lumbosacral region: Secondary | ICD-10-CM | POA: Diagnosis not present

## 2018-06-03 DIAGNOSIS — M531 Cervicobrachial syndrome: Secondary | ICD-10-CM | POA: Diagnosis not present

## 2018-06-04 ENCOUNTER — Ambulatory Visit
Admission: RE | Admit: 2018-06-04 | Discharge: 2018-06-04 | Disposition: A | Discharge status: Home / Self Care | Payer: PPO | Source: Ambulatory Visit | Attending: Internal Medicine | Admitting: Internal Medicine

## 2018-06-04 DIAGNOSIS — Z1231 Encounter for screening mammogram for malignant neoplasm of breast: Secondary | ICD-10-CM

## 2018-06-14 DIAGNOSIS — Z0000 Encounter for general adult medical examination without abnormal findings: Secondary | ICD-10-CM | POA: Diagnosis not present

## 2018-06-14 DIAGNOSIS — E039 Hypothyroidism, unspecified: Secondary | ICD-10-CM | POA: Diagnosis not present

## 2018-06-14 DIAGNOSIS — R002 Palpitations: Secondary | ICD-10-CM | POA: Diagnosis not present

## 2018-06-14 DIAGNOSIS — Z23 Encounter for immunization: Secondary | ICD-10-CM | POA: Diagnosis not present

## 2018-06-21 DIAGNOSIS — M8580 Other specified disorders of bone density and structure, unspecified site: Secondary | ICD-10-CM | POA: Diagnosis not present

## 2018-06-21 DIAGNOSIS — Z0000 Encounter for general adult medical examination without abnormal findings: Secondary | ICD-10-CM | POA: Diagnosis not present

## 2018-06-21 DIAGNOSIS — E039 Hypothyroidism, unspecified: Secondary | ICD-10-CM | POA: Diagnosis not present

## 2018-07-01 DIAGNOSIS — M546 Pain in thoracic spine: Secondary | ICD-10-CM | POA: Diagnosis not present

## 2018-07-01 DIAGNOSIS — M9903 Segmental and somatic dysfunction of lumbar region: Secondary | ICD-10-CM | POA: Diagnosis not present

## 2018-07-01 DIAGNOSIS — M9902 Segmental and somatic dysfunction of thoracic region: Secondary | ICD-10-CM | POA: Diagnosis not present

## 2018-07-01 DIAGNOSIS — M531 Cervicobrachial syndrome: Secondary | ICD-10-CM | POA: Diagnosis not present

## 2018-07-01 DIAGNOSIS — M9901 Segmental and somatic dysfunction of cervical region: Secondary | ICD-10-CM | POA: Diagnosis not present

## 2018-07-01 DIAGNOSIS — M5137 Other intervertebral disc degeneration, lumbosacral region: Secondary | ICD-10-CM | POA: Diagnosis not present

## 2018-07-29 DIAGNOSIS — M5137 Other intervertebral disc degeneration, lumbosacral region: Secondary | ICD-10-CM | POA: Diagnosis not present

## 2018-07-29 DIAGNOSIS — M9901 Segmental and somatic dysfunction of cervical region: Secondary | ICD-10-CM | POA: Diagnosis not present

## 2018-07-29 DIAGNOSIS — M531 Cervicobrachial syndrome: Secondary | ICD-10-CM | POA: Diagnosis not present

## 2018-07-29 DIAGNOSIS — M9903 Segmental and somatic dysfunction of lumbar region: Secondary | ICD-10-CM | POA: Diagnosis not present

## 2018-07-29 DIAGNOSIS — M9902 Segmental and somatic dysfunction of thoracic region: Secondary | ICD-10-CM | POA: Diagnosis not present

## 2018-07-29 DIAGNOSIS — M546 Pain in thoracic spine: Secondary | ICD-10-CM | POA: Diagnosis not present

## 2018-10-09 IMAGING — MG MM DIGITAL SCREENING BILAT W/ TOMO W/ CAD
8 of 12 series · 8 of 28 positions shown · non-contrast
Comparison: Previous exam(s).

CLINICAL DATA: Screening.

EXAM:
2D DIGITAL SCREENING BILATERAL MAMMOGRAM WITH 3D TOMO WITH CAD

[L CC synth-2D]
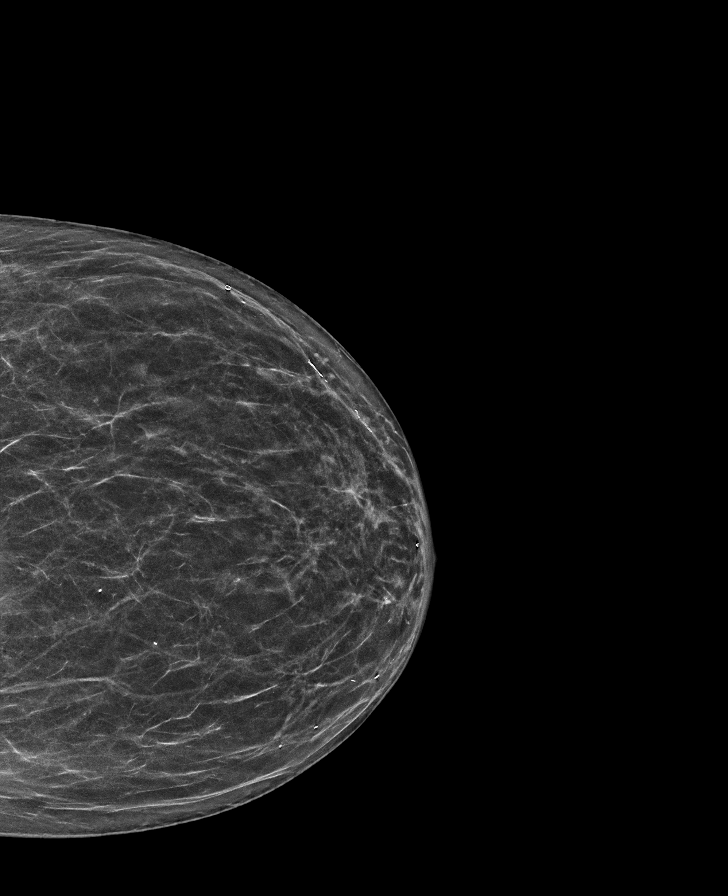

[L CC]
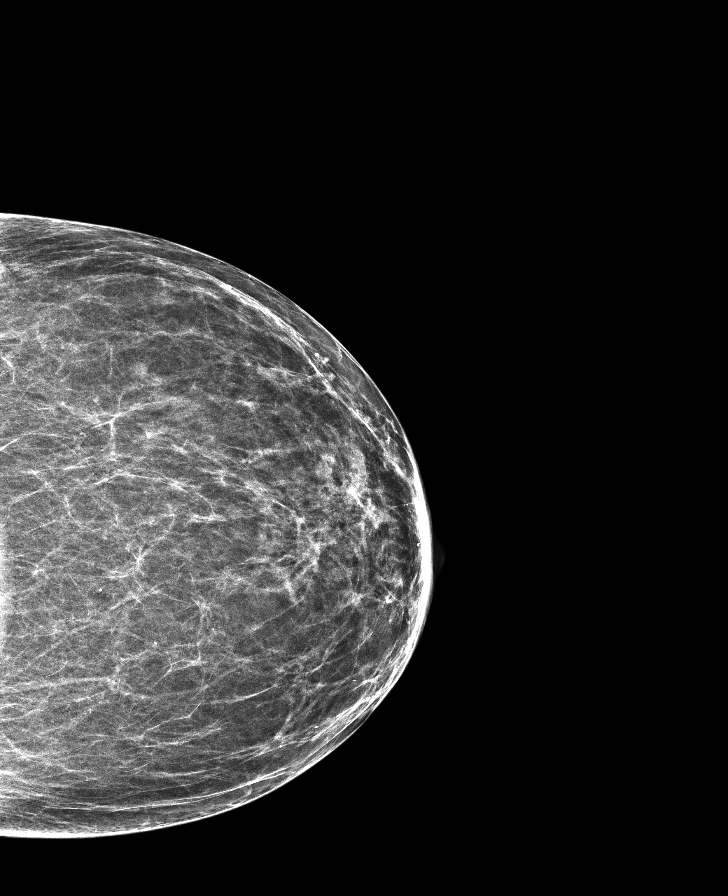

[L MLO synth-2D]
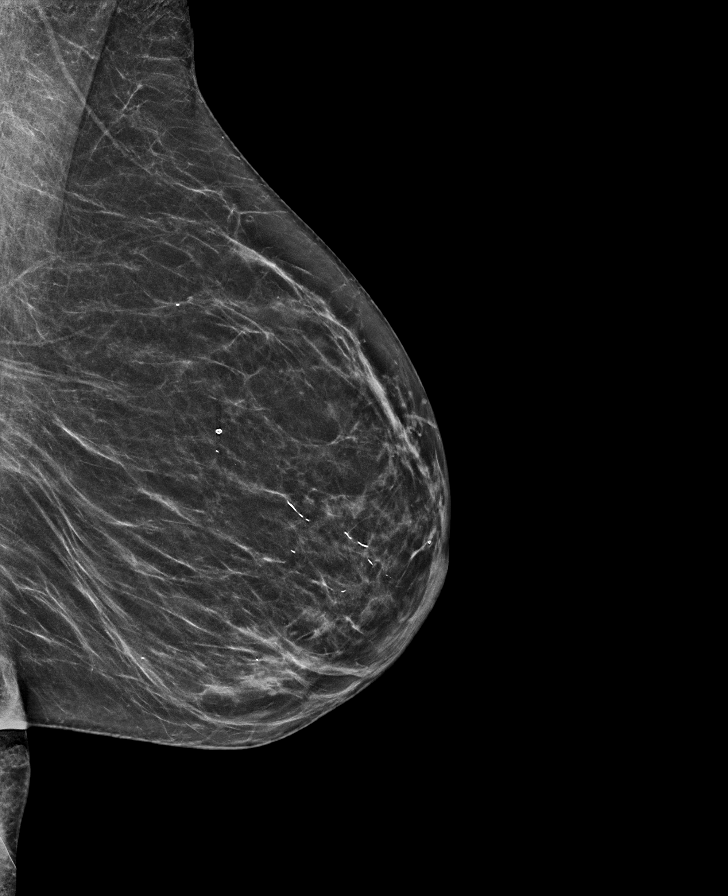

[L MLO]
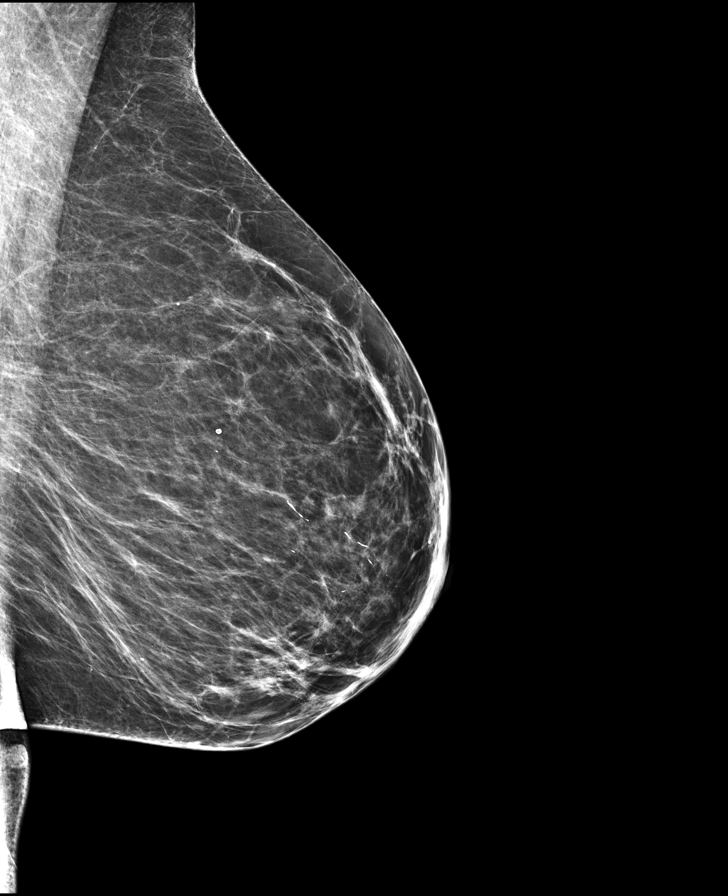

[R MLO]
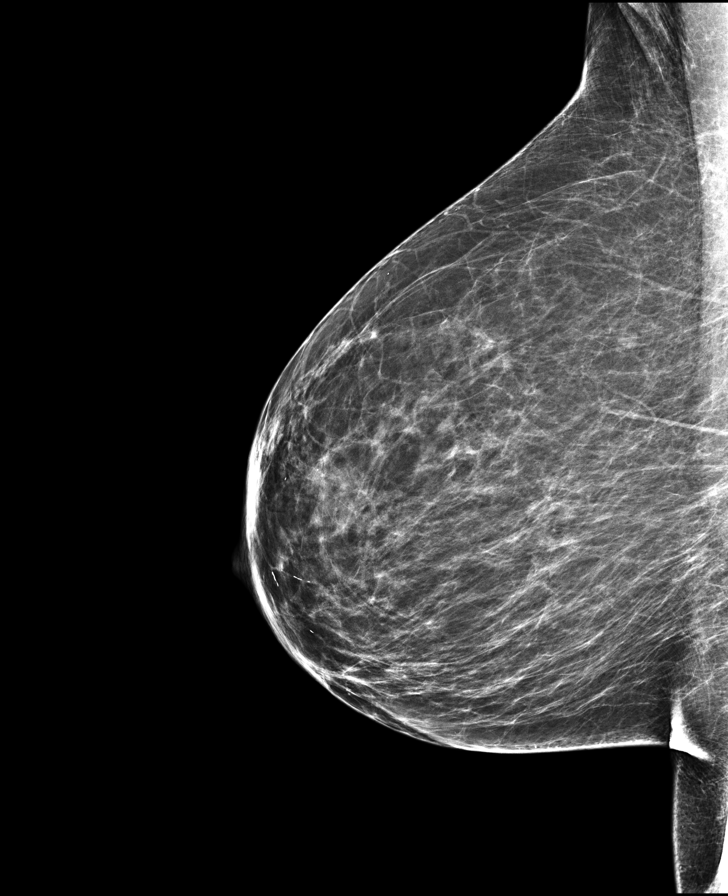

[R CC]
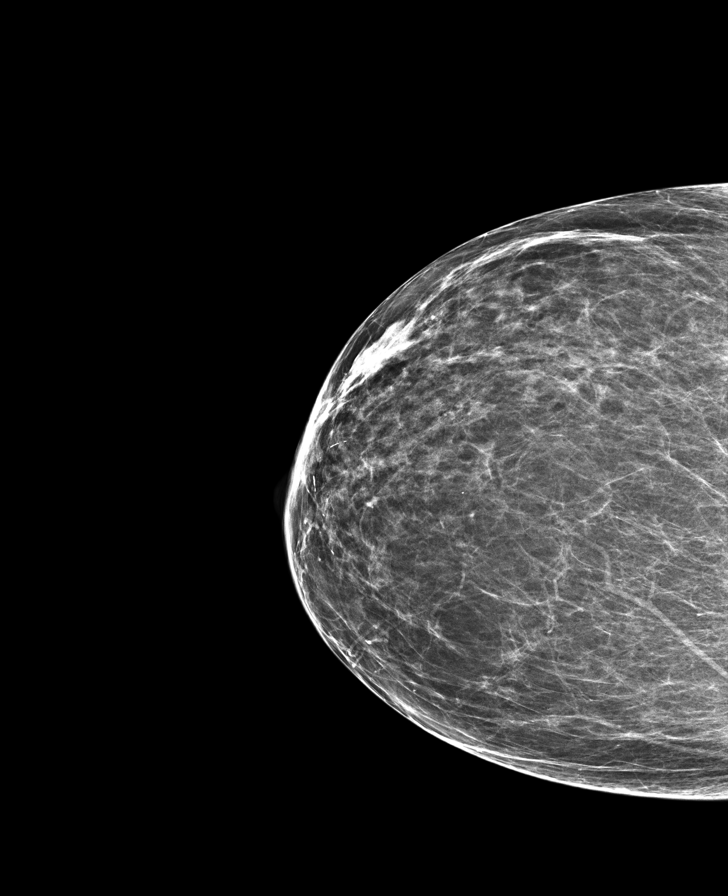

[R CC synth-2D]
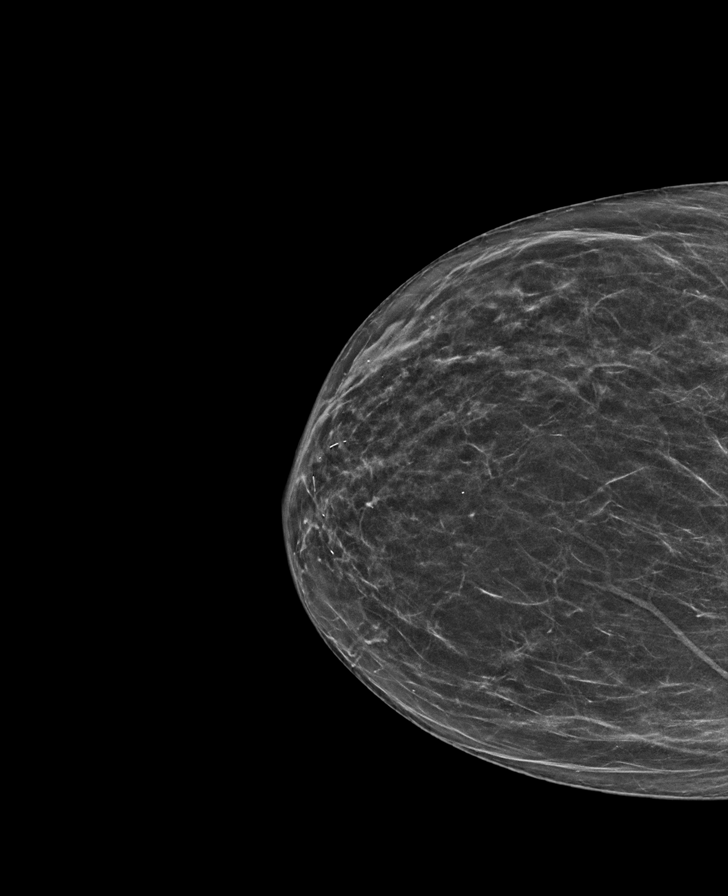

[R MLO synth-2D]
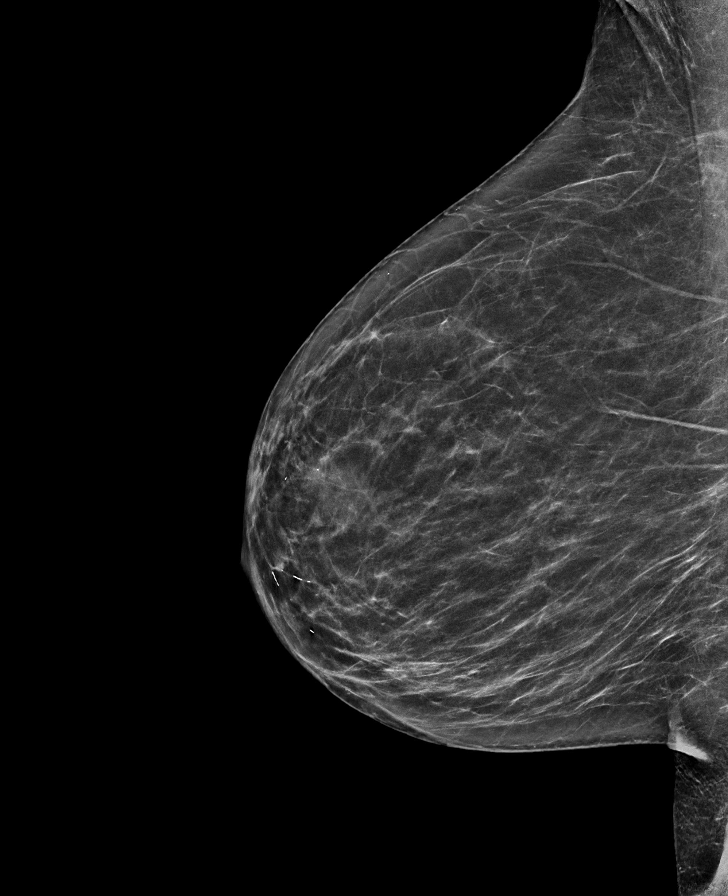

[8 of 28 positions shown; findings below may reference images not displayed]

ACR Breast Density Category b: There are scattered areas of
fibroglandular density.
FINDINGS: There are no findings suspicious for malignancy. Images were
processed with CAD.
IMPRESSION: No mammographic evidence of malignancy. A result letter of this
screening mammogram will be mailed directly to the patient.

RECOMMENDATION:
Screening mammogram in one year. (Code:[id number]-P-ZS0)

BI-RADS CATEGORY  1: Negative.

## 2018-10-15 DIAGNOSIS — D485 Neoplasm of uncertain behavior of skin: Secondary | ICD-10-CM | POA: Diagnosis not present

## 2018-10-15 DIAGNOSIS — B081 Molluscum contagiosum: Secondary | ICD-10-CM | POA: Diagnosis not present

## 2018-10-15 DIAGNOSIS — L821 Other seborrheic keratosis: Secondary | ICD-10-CM | POA: Diagnosis not present

## 2018-10-15 HISTORY — DX: Molluscum contagiosum: B08.1

## 2018-10-18 DIAGNOSIS — R87610 Atypical squamous cells of undetermined significance on cytologic smear of cervix (ASC-US): Secondary | ICD-10-CM | POA: Diagnosis not present

## 2018-10-18 DIAGNOSIS — Z124 Encounter for screening for malignant neoplasm of cervix: Secondary | ICD-10-CM | POA: Diagnosis not present

## 2018-10-18 DIAGNOSIS — Z01419 Encounter for gynecological examination (general) (routine) without abnormal findings: Secondary | ICD-10-CM | POA: Diagnosis not present

## 2018-10-18 DIAGNOSIS — Z8742 Personal history of other diseases of the female genital tract: Secondary | ICD-10-CM | POA: Diagnosis not present

## 2018-10-18 DIAGNOSIS — N9089 Other specified noninflammatory disorders of vulva and perineum: Secondary | ICD-10-CM | POA: Diagnosis not present

## 2018-12-09 DIAGNOSIS — Z0000 Encounter for general adult medical examination without abnormal findings: Secondary | ICD-10-CM | POA: Diagnosis not present

## 2018-12-09 DIAGNOSIS — M8580 Other specified disorders of bone density and structure, unspecified site: Secondary | ICD-10-CM | POA: Diagnosis not present

## 2018-12-09 DIAGNOSIS — E039 Hypothyroidism, unspecified: Secondary | ICD-10-CM | POA: Diagnosis not present

## 2019-02-14 DIAGNOSIS — K5792 Diverticulitis of intestine, part unspecified, without perforation or abscess without bleeding: Secondary | ICD-10-CM | POA: Diagnosis not present

## 2019-02-20 DIAGNOSIS — M9901 Segmental and somatic dysfunction of cervical region: Secondary | ICD-10-CM | POA: Diagnosis not present

## 2019-02-20 DIAGNOSIS — M9903 Segmental and somatic dysfunction of lumbar region: Secondary | ICD-10-CM | POA: Diagnosis not present

## 2019-02-20 DIAGNOSIS — M531 Cervicobrachial syndrome: Secondary | ICD-10-CM | POA: Diagnosis not present

## 2019-02-20 DIAGNOSIS — M9902 Segmental and somatic dysfunction of thoracic region: Secondary | ICD-10-CM | POA: Diagnosis not present

## 2019-02-20 DIAGNOSIS — M5137 Other intervertebral disc degeneration, lumbosacral region: Secondary | ICD-10-CM | POA: Diagnosis not present

## 2019-02-20 DIAGNOSIS — M546 Pain in thoracic spine: Secondary | ICD-10-CM | POA: Diagnosis not present

## 2019-02-27 DIAGNOSIS — M546 Pain in thoracic spine: Secondary | ICD-10-CM | POA: Diagnosis not present

## 2019-02-27 DIAGNOSIS — M9902 Segmental and somatic dysfunction of thoracic region: Secondary | ICD-10-CM | POA: Diagnosis not present

## 2019-02-27 DIAGNOSIS — M5442 Lumbago with sciatica, left side: Secondary | ICD-10-CM | POA: Diagnosis not present

## 2019-02-27 DIAGNOSIS — M531 Cervicobrachial syndrome: Secondary | ICD-10-CM | POA: Diagnosis not present

## 2019-02-27 DIAGNOSIS — M9901 Segmental and somatic dysfunction of cervical region: Secondary | ICD-10-CM | POA: Diagnosis not present

## 2019-02-27 DIAGNOSIS — M9903 Segmental and somatic dysfunction of lumbar region: Secondary | ICD-10-CM | POA: Diagnosis not present

## 2019-04-22 DIAGNOSIS — M9903 Segmental and somatic dysfunction of lumbar region: Secondary | ICD-10-CM | POA: Diagnosis not present

## 2019-04-22 DIAGNOSIS — M531 Cervicobrachial syndrome: Secondary | ICD-10-CM | POA: Diagnosis not present

## 2019-04-22 DIAGNOSIS — M546 Pain in thoracic spine: Secondary | ICD-10-CM | POA: Diagnosis not present

## 2019-04-22 DIAGNOSIS — M9901 Segmental and somatic dysfunction of cervical region: Secondary | ICD-10-CM | POA: Diagnosis not present

## 2019-04-22 DIAGNOSIS — M9902 Segmental and somatic dysfunction of thoracic region: Secondary | ICD-10-CM | POA: Diagnosis not present

## 2019-04-22 DIAGNOSIS — M5442 Lumbago with sciatica, left side: Secondary | ICD-10-CM | POA: Diagnosis not present

## 2019-04-24 ENCOUNTER — Other Ambulatory Visit: Payer: Self-pay | Admitting: Internal Medicine

## 2019-04-24 DIAGNOSIS — Z1231 Encounter for screening mammogram for malignant neoplasm of breast: Secondary | ICD-10-CM

## 2019-05-27 DIAGNOSIS — M531 Cervicobrachial syndrome: Secondary | ICD-10-CM | POA: Diagnosis not present

## 2019-05-27 DIAGNOSIS — M9903 Segmental and somatic dysfunction of lumbar region: Secondary | ICD-10-CM | POA: Diagnosis not present

## 2019-05-27 DIAGNOSIS — M9902 Segmental and somatic dysfunction of thoracic region: Secondary | ICD-10-CM | POA: Diagnosis not present

## 2019-05-27 DIAGNOSIS — M546 Pain in thoracic spine: Secondary | ICD-10-CM | POA: Diagnosis not present

## 2019-05-27 DIAGNOSIS — M9901 Segmental and somatic dysfunction of cervical region: Secondary | ICD-10-CM | POA: Diagnosis not present

## 2019-05-27 DIAGNOSIS — M5442 Lumbago with sciatica, left side: Secondary | ICD-10-CM | POA: Diagnosis not present

## 2019-06-06 ENCOUNTER — Ambulatory Visit
Admission: RE | Admit: 2019-06-06 | Discharge: 2019-06-06 | Disposition: A | Discharge status: Home / Self Care | Payer: PPO | Source: Ambulatory Visit | Attending: Internal Medicine | Admitting: Internal Medicine

## 2019-06-06 DIAGNOSIS — Z1231 Encounter for screening mammogram for malignant neoplasm of breast: Secondary | ICD-10-CM | POA: Insufficient documentation

## 2019-06-10 DIAGNOSIS — D2272 Melanocytic nevi of left lower limb, including hip: Secondary | ICD-10-CM | POA: Diagnosis not present

## 2019-06-10 DIAGNOSIS — D235 Other benign neoplasm of skin of trunk: Secondary | ICD-10-CM | POA: Diagnosis not present

## 2019-06-10 DIAGNOSIS — L821 Other seborrheic keratosis: Secondary | ICD-10-CM | POA: Diagnosis not present

## 2019-06-10 DIAGNOSIS — Z1283 Encounter for screening for malignant neoplasm of skin: Secondary | ICD-10-CM | POA: Diagnosis not present

## 2019-06-10 DIAGNOSIS — L820 Inflamed seborrheic keratosis: Secondary | ICD-10-CM | POA: Diagnosis not present

## 2019-06-10 DIAGNOSIS — L578 Other skin changes due to chronic exposure to nonionizing radiation: Secondary | ICD-10-CM | POA: Diagnosis not present

## 2019-06-10 DIAGNOSIS — L720 Epidermal cyst: Secondary | ICD-10-CM | POA: Diagnosis not present

## 2019-06-10 DIAGNOSIS — D2372 Other benign neoplasm of skin of left lower limb, including hip: Secondary | ICD-10-CM | POA: Diagnosis not present

## 2019-06-10 DIAGNOSIS — D1801 Hemangioma of skin and subcutaneous tissue: Secondary | ICD-10-CM | POA: Diagnosis not present

## 2019-06-11 ENCOUNTER — Ambulatory Visit: Payer: PPO | Attending: Internal Medicine

## 2019-06-11 DIAGNOSIS — Z23 Encounter for immunization: Secondary | ICD-10-CM | POA: Insufficient documentation

## 2019-06-11 NOTE — Progress Notes (Signed)
   Covid-19 Vaccination Clinic  Name:  Patricia Clay    MRN: [id number] DOB: Dec 27, 1945  06/11/2019  [name] was observed post Covid-19 immunization for 15 minutes without incidence. She was provided with Vaccine Information Sheet and instruction to access the V-Safe system.   [name] was instructed to call 911 with any severe reactions post vaccine: Marland Kitchen Difficulty breathing  . Swelling of your face and throat  . A fast heartbeat  . A bad rash all over your body  . Dizziness and weakness    Immunizations Administered    Name Date Dose VIS Date Route   Pfizer COVID-19 Vaccine 06/11/2019 11:30 AM 0.3 mL 05/02/2019 Intramuscular   Manufacturer: [place]   Lot: [id number]   [place]: [id number]

## 2019-06-16 DIAGNOSIS — M8580 Other specified disorders of bone density and structure, unspecified site: Secondary | ICD-10-CM | POA: Diagnosis not present

## 2019-06-16 DIAGNOSIS — E039 Hypothyroidism, unspecified: Secondary | ICD-10-CM | POA: Diagnosis not present

## 2019-06-16 DIAGNOSIS — Z0000 Encounter for general adult medical examination without abnormal findings: Secondary | ICD-10-CM | POA: Diagnosis not present

## 2019-06-16 DIAGNOSIS — R739 Hyperglycemia, unspecified: Secondary | ICD-10-CM | POA: Diagnosis not present

## 2019-06-23 DIAGNOSIS — Z8742 Personal history of other diseases of the female genital tract: Secondary | ICD-10-CM | POA: Diagnosis not present

## 2019-06-23 DIAGNOSIS — Z0000 Encounter for general adult medical examination without abnormal findings: Secondary | ICD-10-CM | POA: Diagnosis not present

## 2019-06-23 DIAGNOSIS — R7309 Other abnormal glucose: Secondary | ICD-10-CM | POA: Diagnosis not present

## 2019-06-23 DIAGNOSIS — E039 Hypothyroidism, unspecified: Secondary | ICD-10-CM | POA: Diagnosis not present

## 2019-06-23 DIAGNOSIS — M8580 Other specified disorders of bone density and structure, unspecified site: Secondary | ICD-10-CM | POA: Diagnosis not present

## 2019-07-01 DIAGNOSIS — M546 Pain in thoracic spine: Secondary | ICD-10-CM | POA: Diagnosis not present

## 2019-07-01 DIAGNOSIS — M9902 Segmental and somatic dysfunction of thoracic region: Secondary | ICD-10-CM | POA: Diagnosis not present

## 2019-07-01 DIAGNOSIS — M9901 Segmental and somatic dysfunction of cervical region: Secondary | ICD-10-CM | POA: Diagnosis not present

## 2019-07-01 DIAGNOSIS — M5442 Lumbago with sciatica, left side: Secondary | ICD-10-CM | POA: Diagnosis not present

## 2019-07-01 DIAGNOSIS — M531 Cervicobrachial syndrome: Secondary | ICD-10-CM | POA: Diagnosis not present

## 2019-07-01 DIAGNOSIS — M9903 Segmental and somatic dysfunction of lumbar region: Secondary | ICD-10-CM | POA: Diagnosis not present

## 2019-07-02 ENCOUNTER — Ambulatory Visit: Payer: PPO | Attending: Internal Medicine

## 2019-07-02 DIAGNOSIS — Z23 Encounter for immunization: Secondary | ICD-10-CM | POA: Insufficient documentation

## 2019-07-02 NOTE — Progress Notes (Signed)
   Covid-19 Vaccination Clinic  Name:  Patricia Clay    MRN: [id number] DOB: [birth year]  07/02/2019  [name] was observed post Covid-19 immunization for 15 minutes without incidence. She was provided with Vaccine Information Sheet and instruction to access the V-Safe system.   [name] was instructed to call 911 with any severe reactions post vaccine: Marland Kitchen Difficulty breathing  . Swelling of your face and throat  . A fast heartbeat  . A bad rash all over your body  . Dizziness and weakness    Immunizations Administered    Name Date Dose VIS Date Route   Pfizer COVID-19 Vaccine 07/02/2019  4:24 PM 0.3 mL 05/02/2019 Intramuscular   Manufacturer: [place]   Lot: [id number]   [place]: [id number]

## 2019-07-31 DIAGNOSIS — M5442 Lumbago with sciatica, left side: Secondary | ICD-10-CM | POA: Diagnosis not present

## 2019-07-31 DIAGNOSIS — M9902 Segmental and somatic dysfunction of thoracic region: Secondary | ICD-10-CM | POA: Diagnosis not present

## 2019-07-31 DIAGNOSIS — M9903 Segmental and somatic dysfunction of lumbar region: Secondary | ICD-10-CM | POA: Diagnosis not present

## 2019-07-31 DIAGNOSIS — M531 Cervicobrachial syndrome: Secondary | ICD-10-CM | POA: Diagnosis not present

## 2019-07-31 DIAGNOSIS — M546 Pain in thoracic spine: Secondary | ICD-10-CM | POA: Diagnosis not present

## 2019-07-31 DIAGNOSIS — M9901 Segmental and somatic dysfunction of cervical region: Secondary | ICD-10-CM | POA: Diagnosis not present

## 2019-08-15 ENCOUNTER — Other Ambulatory Visit: Payer: Self-pay

## 2019-08-15 ENCOUNTER — Ambulatory Visit: Payer: PPO | Admitting: Dermatology

## 2019-08-15 DIAGNOSIS — D1801 Hemangioma of skin and subcutaneous tissue: Secondary | ICD-10-CM | POA: Diagnosis not present

## 2019-08-15 DIAGNOSIS — L820 Inflamed seborrheic keratosis: Secondary | ICD-10-CM | POA: Diagnosis not present

## 2019-08-15 DIAGNOSIS — L821 Other seborrheic keratosis: Secondary | ICD-10-CM

## 2019-08-15 DIAGNOSIS — D1800 Hemangioma unspecified site: Secondary | ICD-10-CM

## 2019-08-15 NOTE — Progress Notes (Signed)
   Follow-Up Visit   Subjective  Patricia Clay is a 74 y.o. female who presents for the following: lesions (R breast, back , R axilla - R breast and back lesions have been treated before with LN2, but the R axilla lesion is new.).  Spots are irritated, itchy.  The following portions of the chart were reviewed this encounter and updated as appropriate:     Review of Systems: No other skin or systemic complaints.  Objective  Well appearing patient in no apparent distress; mood and affect are within normal limits.  A focused examination was performed including the trunk. Relevant physical exam findings are noted in the Assessment and Plan.  Objective  Left Lower Back: Red papules  Objective  R lower back x 1, L mid back at braline x 1, R inframammary x 1 (residual), R axilla x 1 (4): Erythematous keratotic or waxy stuck-on papule or plaque.   Objective  Trunk: Stuck-on, waxy, tan-brown papule or plaque --Discussed benign etiology and prognosis.    Assessment & Plan  Hemangioma, unspecified site Left Lower Back  Benign, observe.    Inflamed seborrheic keratosis (4) R lower back x 1, L mid back at braline x 1, R inframammary x 1 (residual), R axilla x 1  Destruction of lesion - R lower back x 1, L mid back at braline x 1, R inframammary x 1 (residual), R axilla x 1  Destruction method: cryotherapy   Informed consent: discussed and consent obtained   Lesion destroyed using liquid nitrogen: Yes   Region frozen until ice ball extended beyond lesion: Yes   Outcome: patient tolerated procedure well with no complications   Post-procedure details: wound care instructions given    Seborrheic keratosis Trunk  Benign, observe.     Return for TBSE - as scheduled.   Tanya Nones, CMA, am acting as scribe for Brendolyn Patty, MD .

## 2019-08-26 DIAGNOSIS — M546 Pain in thoracic spine: Secondary | ICD-10-CM | POA: Diagnosis not present

## 2019-08-26 DIAGNOSIS — M531 Cervicobrachial syndrome: Secondary | ICD-10-CM | POA: Diagnosis not present

## 2019-08-26 DIAGNOSIS — M9902 Segmental and somatic dysfunction of thoracic region: Secondary | ICD-10-CM | POA: Diagnosis not present

## 2019-08-26 DIAGNOSIS — M9901 Segmental and somatic dysfunction of cervical region: Secondary | ICD-10-CM | POA: Diagnosis not present

## 2019-08-26 DIAGNOSIS — M9903 Segmental and somatic dysfunction of lumbar region: Secondary | ICD-10-CM | POA: Diagnosis not present

## 2019-08-26 DIAGNOSIS — M5442 Lumbago with sciatica, left side: Secondary | ICD-10-CM | POA: Diagnosis not present

## 2019-10-01 DIAGNOSIS — M9902 Segmental and somatic dysfunction of thoracic region: Secondary | ICD-10-CM | POA: Diagnosis not present

## 2019-10-01 DIAGNOSIS — M546 Pain in thoracic spine: Secondary | ICD-10-CM | POA: Diagnosis not present

## 2019-10-01 DIAGNOSIS — M531 Cervicobrachial syndrome: Secondary | ICD-10-CM | POA: Diagnosis not present

## 2019-10-01 DIAGNOSIS — M9901 Segmental and somatic dysfunction of cervical region: Secondary | ICD-10-CM | POA: Diagnosis not present

## 2019-10-01 DIAGNOSIS — M9903 Segmental and somatic dysfunction of lumbar region: Secondary | ICD-10-CM | POA: Diagnosis not present

## 2019-10-01 DIAGNOSIS — M5442 Lumbago with sciatica, left side: Secondary | ICD-10-CM | POA: Diagnosis not present

## 2019-10-17 DIAGNOSIS — H02889 Meibomian gland dysfunction of unspecified eye, unspecified eyelid: Secondary | ICD-10-CM | POA: Diagnosis not present

## 2019-10-29 DIAGNOSIS — M9902 Segmental and somatic dysfunction of thoracic region: Secondary | ICD-10-CM | POA: Diagnosis not present

## 2019-10-29 DIAGNOSIS — M531 Cervicobrachial syndrome: Secondary | ICD-10-CM | POA: Diagnosis not present

## 2019-10-29 DIAGNOSIS — M5442 Lumbago with sciatica, left side: Secondary | ICD-10-CM | POA: Diagnosis not present

## 2019-10-29 DIAGNOSIS — M9901 Segmental and somatic dysfunction of cervical region: Secondary | ICD-10-CM | POA: Diagnosis not present

## 2019-10-29 DIAGNOSIS — M9903 Segmental and somatic dysfunction of lumbar region: Secondary | ICD-10-CM | POA: Diagnosis not present

## 2019-10-29 DIAGNOSIS — M546 Pain in thoracic spine: Secondary | ICD-10-CM | POA: Diagnosis not present

## 2019-12-15 DIAGNOSIS — Z0000 Encounter for general adult medical examination without abnormal findings: Secondary | ICD-10-CM | POA: Diagnosis not present

## 2019-12-15 DIAGNOSIS — E039 Hypothyroidism, unspecified: Secondary | ICD-10-CM | POA: Diagnosis not present

## 2019-12-15 DIAGNOSIS — I10 Essential (primary) hypertension: Secondary | ICD-10-CM | POA: Diagnosis not present

## 2019-12-15 DIAGNOSIS — R7309 Other abnormal glucose: Secondary | ICD-10-CM | POA: Diagnosis not present

## 2019-12-19 DIAGNOSIS — G43109 Migraine with aura, not intractable, without status migrainosus: Secondary | ICD-10-CM | POA: Diagnosis not present

## 2019-12-23 DIAGNOSIS — M5442 Lumbago with sciatica, left side: Secondary | ICD-10-CM | POA: Diagnosis not present

## 2019-12-23 DIAGNOSIS — M9901 Segmental and somatic dysfunction of cervical region: Secondary | ICD-10-CM | POA: Diagnosis not present

## 2019-12-23 DIAGNOSIS — M546 Pain in thoracic spine: Secondary | ICD-10-CM | POA: Diagnosis not present

## 2019-12-23 DIAGNOSIS — M9903 Segmental and somatic dysfunction of lumbar region: Secondary | ICD-10-CM | POA: Diagnosis not present

## 2019-12-23 DIAGNOSIS — M9902 Segmental and somatic dysfunction of thoracic region: Secondary | ICD-10-CM | POA: Diagnosis not present

## 2019-12-23 DIAGNOSIS — M531 Cervicobrachial syndrome: Secondary | ICD-10-CM | POA: Diagnosis not present

## 2020-01-20 DIAGNOSIS — M531 Cervicobrachial syndrome: Secondary | ICD-10-CM | POA: Diagnosis not present

## 2020-01-20 DIAGNOSIS — M9903 Segmental and somatic dysfunction of lumbar region: Secondary | ICD-10-CM | POA: Diagnosis not present

## 2020-01-20 DIAGNOSIS — M9901 Segmental and somatic dysfunction of cervical region: Secondary | ICD-10-CM | POA: Diagnosis not present

## 2020-01-20 DIAGNOSIS — M546 Pain in thoracic spine: Secondary | ICD-10-CM | POA: Diagnosis not present

## 2020-01-20 DIAGNOSIS — M9902 Segmental and somatic dysfunction of thoracic region: Secondary | ICD-10-CM | POA: Diagnosis not present

## 2020-01-20 DIAGNOSIS — M5442 Lumbago with sciatica, left side: Secondary | ICD-10-CM | POA: Diagnosis not present

## 2020-02-19 DIAGNOSIS — M9901 Segmental and somatic dysfunction of cervical region: Secondary | ICD-10-CM | POA: Diagnosis not present

## 2020-02-19 DIAGNOSIS — M9902 Segmental and somatic dysfunction of thoracic region: Secondary | ICD-10-CM | POA: Diagnosis not present

## 2020-02-19 DIAGNOSIS — M531 Cervicobrachial syndrome: Secondary | ICD-10-CM | POA: Diagnosis not present

## 2020-02-19 DIAGNOSIS — M9903 Segmental and somatic dysfunction of lumbar region: Secondary | ICD-10-CM | POA: Diagnosis not present

## 2020-02-19 DIAGNOSIS — M546 Pain in thoracic spine: Secondary | ICD-10-CM | POA: Diagnosis not present

## 2020-02-19 DIAGNOSIS — M5442 Lumbago with sciatica, left side: Secondary | ICD-10-CM | POA: Diagnosis not present

## 2020-02-23 ENCOUNTER — Ambulatory Visit: Payer: PPO | Attending: Internal Medicine

## 2020-02-23 DIAGNOSIS — Z23 Encounter for immunization: Secondary | ICD-10-CM

## 2020-02-23 NOTE — Progress Notes (Signed)
   Covid-19 Vaccination Clinic  Name:  Patricia Clay    MRN: [id number] DOB: February 01, 1946  02/23/2020  [name] was observed post Covid-19 immunization for 15 minutes without incident. She was provided with Vaccine Information Sheet and instruction to access the V-Safe system.   [name] was instructed to call 911 with any severe reactions post vaccine: Marland Kitchen Difficulty breathing  . Swelling of face and throat  . A fast heartbeat  . A bad rash all over body  . Dizziness and weakness

## 2020-03-04 DIAGNOSIS — M9901 Segmental and somatic dysfunction of cervical region: Secondary | ICD-10-CM | POA: Diagnosis not present

## 2020-03-04 DIAGNOSIS — M9902 Segmental and somatic dysfunction of thoracic region: Secondary | ICD-10-CM | POA: Diagnosis not present

## 2020-03-04 DIAGNOSIS — M546 Pain in thoracic spine: Secondary | ICD-10-CM | POA: Diagnosis not present

## 2020-03-04 DIAGNOSIS — M5442 Lumbago with sciatica, left side: Secondary | ICD-10-CM | POA: Diagnosis not present

## 2020-03-04 DIAGNOSIS — M9903 Segmental and somatic dysfunction of lumbar region: Secondary | ICD-10-CM | POA: Diagnosis not present

## 2020-03-04 DIAGNOSIS — M531 Cervicobrachial syndrome: Secondary | ICD-10-CM | POA: Diagnosis not present

## 2020-03-15 DIAGNOSIS — M9901 Segmental and somatic dysfunction of cervical region: Secondary | ICD-10-CM | POA: Diagnosis not present

## 2020-03-15 DIAGNOSIS — M531 Cervicobrachial syndrome: Secondary | ICD-10-CM | POA: Diagnosis not present

## 2020-03-15 DIAGNOSIS — M9903 Segmental and somatic dysfunction of lumbar region: Secondary | ICD-10-CM | POA: Diagnosis not present

## 2020-03-15 DIAGNOSIS — M9902 Segmental and somatic dysfunction of thoracic region: Secondary | ICD-10-CM | POA: Diagnosis not present

## 2020-03-15 DIAGNOSIS — M546 Pain in thoracic spine: Secondary | ICD-10-CM | POA: Diagnosis not present

## 2020-03-15 DIAGNOSIS — M5442 Lumbago with sciatica, left side: Secondary | ICD-10-CM | POA: Diagnosis not present

## 2020-03-16 DIAGNOSIS — Z20822 Contact with and (suspected) exposure to covid-19: Secondary | ICD-10-CM | POA: Diagnosis not present

## 2020-04-12 DIAGNOSIS — M9901 Segmental and somatic dysfunction of cervical region: Secondary | ICD-10-CM | POA: Diagnosis not present

## 2020-04-12 DIAGNOSIS — M546 Pain in thoracic spine: Secondary | ICD-10-CM | POA: Diagnosis not present

## 2020-04-12 DIAGNOSIS — M5442 Lumbago with sciatica, left side: Secondary | ICD-10-CM | POA: Diagnosis not present

## 2020-04-12 DIAGNOSIS — M531 Cervicobrachial syndrome: Secondary | ICD-10-CM | POA: Diagnosis not present

## 2020-04-12 DIAGNOSIS — M9903 Segmental and somatic dysfunction of lumbar region: Secondary | ICD-10-CM | POA: Diagnosis not present

## 2020-04-12 DIAGNOSIS — M9902 Segmental and somatic dysfunction of thoracic region: Secondary | ICD-10-CM | POA: Diagnosis not present

## 2020-04-29 ENCOUNTER — Other Ambulatory Visit: Payer: Self-pay | Admitting: Internal Medicine

## 2020-04-29 DIAGNOSIS — Z1231 Encounter for screening mammogram for malignant neoplasm of breast: Secondary | ICD-10-CM

## 2020-05-25 DIAGNOSIS — M9902 Segmental and somatic dysfunction of thoracic region: Secondary | ICD-10-CM | POA: Diagnosis not present

## 2020-05-25 DIAGNOSIS — M5442 Lumbago with sciatica, left side: Secondary | ICD-10-CM | POA: Diagnosis not present

## 2020-05-25 DIAGNOSIS — M531 Cervicobrachial syndrome: Secondary | ICD-10-CM | POA: Diagnosis not present

## 2020-05-25 DIAGNOSIS — M9901 Segmental and somatic dysfunction of cervical region: Secondary | ICD-10-CM | POA: Diagnosis not present

## 2020-05-25 DIAGNOSIS — M9903 Segmental and somatic dysfunction of lumbar region: Secondary | ICD-10-CM | POA: Diagnosis not present

## 2020-05-25 DIAGNOSIS — M546 Pain in thoracic spine: Secondary | ICD-10-CM | POA: Diagnosis not present

## 2020-06-08 ENCOUNTER — Ambulatory Visit
Admission: RE | Admit: 2020-06-08 | Discharge: 2020-06-08 | Disposition: A | Discharge status: Home / Self Care | Payer: PPO | Source: Ambulatory Visit | Attending: Internal Medicine | Admitting: Internal Medicine

## 2020-06-08 ENCOUNTER — Other Ambulatory Visit: Payer: Self-pay

## 2020-06-08 DIAGNOSIS — Z1231 Encounter for screening mammogram for malignant neoplasm of breast: Secondary | ICD-10-CM | POA: Insufficient documentation

## 2020-06-15 ENCOUNTER — Encounter: Payer: Self-pay | Admitting: Dermatology

## 2020-06-15 ENCOUNTER — Other Ambulatory Visit: Payer: Self-pay

## 2020-06-15 ENCOUNTER — Ambulatory Visit: Payer: PPO | Admitting: Dermatology

## 2020-06-15 DIAGNOSIS — D2371 Other benign neoplasm of skin of right lower limb, including hip: Secondary | ICD-10-CM | POA: Diagnosis not present

## 2020-06-15 DIAGNOSIS — H01133 Eczematous dermatitis of right eye, unspecified eyelid: Secondary | ICD-10-CM

## 2020-06-15 DIAGNOSIS — L720 Epidermal cyst: Secondary | ICD-10-CM | POA: Diagnosis not present

## 2020-06-15 DIAGNOSIS — L300 Nummular dermatitis: Secondary | ICD-10-CM | POA: Diagnosis not present

## 2020-06-15 DIAGNOSIS — D2271 Melanocytic nevi of right lower limb, including hip: Secondary | ICD-10-CM | POA: Diagnosis not present

## 2020-06-15 DIAGNOSIS — L821 Other seborrheic keratosis: Secondary | ICD-10-CM | POA: Diagnosis not present

## 2020-06-15 DIAGNOSIS — L578 Other skin changes due to chronic exposure to nonionizing radiation: Secondary | ICD-10-CM | POA: Diagnosis not present

## 2020-06-15 DIAGNOSIS — D1800 Hemangioma unspecified site: Secondary | ICD-10-CM

## 2020-06-15 DIAGNOSIS — Z1283 Encounter for screening for malignant neoplasm of skin: Secondary | ICD-10-CM | POA: Diagnosis not present

## 2020-06-15 DIAGNOSIS — L820 Inflamed seborrheic keratosis: Secondary | ICD-10-CM | POA: Diagnosis not present

## 2020-06-15 DIAGNOSIS — D229 Melanocytic nevi, unspecified: Secondary | ICD-10-CM

## 2020-06-15 DIAGNOSIS — H01131 Eczematous dermatitis of right upper eyelid: Secondary | ICD-10-CM

## 2020-06-15 NOTE — Patient Instructions (Addendum)
Cryotherapy Aftercare  . Wash gently with soap and water everyday.   Marland Kitchen Apply Vaseline and Band-Aid daily until healed.   Dry Skin Care  What causes dry skin?  Dry skin is common and results from inadequate moisture in the outer skin layers. Dry skin usually results from the excessive loss of moisture from the skin surface. This occurs due to two major factors: 1. Normally the skin's oil glands deposit a layer of oil on the skin's surface. This layer of oil prevents the loss of moisture from the skin. Exposure to soaps, cleaners, solvents, and disinfectants removes this oily film, allowing water to escape. 2. Water loss from the skin increases when the humidity is low. During winter months we spend a lot of time indoors where the air is heated. Heated air has very low humidity. This also contributes to dry skin.  A tendency for dry skin may accompany such disorders as eczema. Also, as people age, the number of functioning oil glands decreases, and the tendency toward dry skin can be a sensation of skin tightness when emerging from the shower.  How do I manage dry skin?  1. Humidify your environment. This can be accomplished by using a humidifier in your bedroom at night during winter months. 2. Bathing can actually put moisture back into your skin if done right. Take the following steps while bathing to sooth dry skin:  Avoid hot water, which only dries the skin and makes itching worse. Use warm water.  Avoid washcloths or extensive rubbing or scrubbing.  Use mild soaps like unscented Dove, Oil of Olay, Cetaphil, Basis, or CeraVe.  If you take baths rather than showers, rinse off soap residue with clean water before getting out of tub.  Once out of the shower/tub, pat dry gently with a soft towel. Leave your skin damp.  While still damp, apply any medicated ointment/cream you were prescribed to the affected areas. After you apply your medicated ointment/cream, then apply your  moisturizer to your whole body.This is the most important step in dry skin care. If this is omitted, your skin will continue to be dry.  The choice of moisturizer is also very important. In general, lotion will not provider enough moisture to severely dry skin because it is water based. You should use an ointment or cream. Moisturizers should also be unscented. Good choices include Vaseline (plain petrolatum), Aquaphor, Cetaphil, CeraVe, Vanicream, DML Forte, Aveeno moisture, or Eucerin Cream.  Bath oils can be helpful, but do not replace the application of moisturizer after the bath. In addition, they make the tub slippery causing an increased risk for falls. Therefore, we do not recommend their use.   Eucrisa Ointment - Apply to affected area eyelid 1-2 times a day until improved.

## 2020-06-15 NOTE — Progress Notes (Signed)
Follow-Up Visit   Subjective  Patricia Clay is a 75 y.o. female who presents for the following: Annual Exam (Patient has a rash on her back and eyelid, slightly itchy, not currently using anything. She also has a new spot on her shoulder she would like checked. ).It gets irritated by her bra.  She has had eyelid surgery in past and has trouble with the rash off and on ever since.  The following portions of the chart were reviewed this encounter and updated as appropriate:       Review of Systems:  No other skin or systemic complaints except as noted in HPI or Assessment and Plan.  Objective  Well appearing patient in no apparent distress; mood and affect are within normal limits.  A full examination was performed including scalp, head, eyes, ears, nose, lips, neck, chest, axillae, abdomen, back, buttocks, bilateral upper extremities, bilateral lower extremities, hands, feet, fingers, toes, fingernails, and toenails. All findings within normal limits unless otherwise noted below.  Objective  R thenar hand dorsum: 7.69mm triangular-shaped waxy brown macule  Objective  Left Medial Calf: 5.64mm speckled brown macule, triangular-shaped with tail  Objective  Left Upper Back: 1.0cm Firm subcutaneous nodule.   Objective  Upper back, posterior neck: Pink excoriated papules on upper back and post neck.  Objective  Right Shoulder at braline x 1: Erythematous keratotic or waxy stuck-on papule or plaque.   Objective  Right Upper Eyelid: Dusky erythema, mild scale   Assessment & Plan   Skin cancer screening performed today.  Actinic Damage - chronic, secondary to cumulative UV radiation exposure/sun exposure over time - diffuse scaly erythematous macules with underlying dyspigmentation - Recommend daily broad spectrum sunscreen SPF 30+ to sun-exposed areas, reapply every 2 hours as needed.  - Call for new or changing lesions.  Melanocytic Nevi - Tan-brown and/or  pink-flesh-colored symmetric macules and papules - Benign appearing on exam today - Observation - Call clinic for new or changing moles - Recommend daily use of broad spectrum spf 30+ sunscreen to sun-exposed areas.   Seborrheic Keratoses - Stuck-on, waxy, tan-brown papules and plaques  - Discussed benign etiology and prognosis. - Observe - Call for any changes  Hemangiomas - Red papules - Discussed benign nature - Observe - Call for any changes  Dermatofibroma - Firm pink/brown papulenodule with dimple sign of the right lower pretibia - Benign appearing - Call for any changes   Seborrheic keratosis R thenar hand dorsum  Stable from previous visit. Benign, observe.    Nevus Left Medial Calf  Benign-appearing.  Stable from previous visit. Observation.  Call clinic for new or changing moles.  Recommend daily use of broad spectrum spf 30+ sunscreen to sun-exposed areas.    Epidermal inclusion cyst Left Upper Back  Benign-appearing. Exam most consistent with an epidermal inclusion cyst. Discussed that a cyst is a benign growth that can grow over time and sometimes get irritated or inflamed. Recommend observation if it is not bothersome. Discussed option of surgical excision to remove it if it is growing, symptomatic, or other changes noted. Please call for new or changing lesions so they can be evaluated.    Nummular dermatitis Upper back, posterior neck  Discussed topical steroid prescription cream for itchy rash, pt defers today.  Will call back for Rx if doesn't improve  Recommend mild soap and moisturizing cream 1-2 times daily.  Samples of Eucerin, .Aveeno eczema, Dove body wash.    Inflamed seborrheic keratosis Right Shoulder at braline  x 1  Destruction of lesion - Right Shoulder at braline x 1  Destruction method: cryotherapy   Informed consent: discussed and consent obtained   Lesion destroyed using liquid nitrogen: Yes   Region frozen until ice ball  extended beyond lesion: Yes   Outcome: patient tolerated procedure well with no complications   Post-procedure details: wound care instructions given    Eyelid dermatitis, eczematous, right Right Upper Eyelid  Start Eucrisa Ointment Apply to AA rash twice daily until rash improved and prn recurrence. Samples given.  Return in about 1 year (around 06/15/2021) for TBSE.   IJamesetta Orleans, CMA, am acting as scribe for Brendolyn Patty, MD .  Documentation: I have reviewed the above documentation for accuracy and completeness, and I agree with the above.  Brendolyn Patty MD

## 2020-06-16 DIAGNOSIS — Z0000 Encounter for general adult medical examination without abnormal findings: Secondary | ICD-10-CM | POA: Diagnosis not present

## 2020-06-16 DIAGNOSIS — R7309 Other abnormal glucose: Secondary | ICD-10-CM | POA: Diagnosis not present

## 2020-06-16 DIAGNOSIS — E039 Hypothyroidism, unspecified: Secondary | ICD-10-CM | POA: Diagnosis not present

## 2020-06-16 DIAGNOSIS — M8580 Other specified disorders of bone density and structure, unspecified site: Secondary | ICD-10-CM | POA: Diagnosis not present

## 2020-06-16 DIAGNOSIS — Z8742 Personal history of other diseases of the female genital tract: Secondary | ICD-10-CM | POA: Diagnosis not present

## 2020-06-17 DIAGNOSIS — Z1331 Encounter for screening for depression: Secondary | ICD-10-CM | POA: Diagnosis not present

## 2020-06-17 DIAGNOSIS — Z01419 Encounter for gynecological examination (general) (routine) without abnormal findings: Secondary | ICD-10-CM | POA: Diagnosis not present

## 2020-06-17 DIAGNOSIS — Z1151 Encounter for screening for human papillomavirus (HPV): Secondary | ICD-10-CM | POA: Diagnosis not present

## 2020-06-17 DIAGNOSIS — Z124 Encounter for screening for malignant neoplasm of cervix: Secondary | ICD-10-CM | POA: Diagnosis not present

## 2020-06-23 DIAGNOSIS — Z124 Encounter for screening for malignant neoplasm of cervix: Secondary | ICD-10-CM | POA: Diagnosis not present

## 2020-06-23 DIAGNOSIS — R7989 Other specified abnormal findings of blood chemistry: Secondary | ICD-10-CM | POA: Diagnosis not present

## 2020-06-23 DIAGNOSIS — E039 Hypothyroidism, unspecified: Secondary | ICD-10-CM | POA: Diagnosis not present

## 2020-06-23 DIAGNOSIS — R002 Palpitations: Secondary | ICD-10-CM | POA: Diagnosis not present

## 2020-06-23 DIAGNOSIS — Z0000 Encounter for general adult medical examination without abnormal findings: Secondary | ICD-10-CM | POA: Diagnosis not present

## 2020-06-23 DIAGNOSIS — Z1331 Encounter for screening for depression: Secondary | ICD-10-CM | POA: Diagnosis not present

## 2020-06-23 DIAGNOSIS — Z8742 Personal history of other diseases of the female genital tract: Secondary | ICD-10-CM | POA: Diagnosis not present

## 2020-07-01 DIAGNOSIS — M546 Pain in thoracic spine: Secondary | ICD-10-CM | POA: Diagnosis not present

## 2020-07-01 DIAGNOSIS — M9902 Segmental and somatic dysfunction of thoracic region: Secondary | ICD-10-CM | POA: Diagnosis not present

## 2020-07-01 DIAGNOSIS — M9903 Segmental and somatic dysfunction of lumbar region: Secondary | ICD-10-CM | POA: Diagnosis not present

## 2020-07-01 DIAGNOSIS — M5137 Other intervertebral disc degeneration, lumbosacral region: Secondary | ICD-10-CM | POA: Diagnosis not present

## 2020-07-01 DIAGNOSIS — M9901 Segmental and somatic dysfunction of cervical region: Secondary | ICD-10-CM | POA: Diagnosis not present

## 2020-07-01 DIAGNOSIS — M531 Cervicobrachial syndrome: Secondary | ICD-10-CM | POA: Diagnosis not present

## 2020-07-07 DIAGNOSIS — M8588 Other specified disorders of bone density and structure, other site: Secondary | ICD-10-CM | POA: Diagnosis not present

## 2020-08-03 DIAGNOSIS — M9903 Segmental and somatic dysfunction of lumbar region: Secondary | ICD-10-CM | POA: Diagnosis not present

## 2020-08-03 DIAGNOSIS — M9901 Segmental and somatic dysfunction of cervical region: Secondary | ICD-10-CM | POA: Diagnosis not present

## 2020-08-03 DIAGNOSIS — M5137 Other intervertebral disc degeneration, lumbosacral region: Secondary | ICD-10-CM | POA: Diagnosis not present

## 2020-08-03 DIAGNOSIS — M531 Cervicobrachial syndrome: Secondary | ICD-10-CM | POA: Diagnosis not present

## 2020-08-03 DIAGNOSIS — M546 Pain in thoracic spine: Secondary | ICD-10-CM | POA: Diagnosis not present

## 2020-08-03 DIAGNOSIS — M9902 Segmental and somatic dysfunction of thoracic region: Secondary | ICD-10-CM | POA: Diagnosis not present

## 2020-08-11 ENCOUNTER — Other Ambulatory Visit: Payer: Self-pay

## 2020-08-11 ENCOUNTER — Ambulatory Visit: Payer: PPO | Admitting: Podiatry

## 2020-08-11 ENCOUNTER — Encounter: Payer: Self-pay | Admitting: Podiatry

## 2020-08-11 DIAGNOSIS — M7540 Impingement syndrome of unspecified shoulder: Secondary | ICD-10-CM | POA: Insufficient documentation

## 2020-08-11 DIAGNOSIS — L603 Nail dystrophy: Secondary | ICD-10-CM | POA: Diagnosis not present

## 2020-08-11 DIAGNOSIS — B351 Tinea unguium: Secondary | ICD-10-CM | POA: Diagnosis not present

## 2020-08-11 DIAGNOSIS — R87619 Unspecified abnormal cytological findings in specimens from cervix uteri: Secondary | ICD-10-CM | POA: Insufficient documentation

## 2020-08-11 DIAGNOSIS — R002 Palpitations: Secondary | ICD-10-CM | POA: Insufficient documentation

## 2020-08-11 DIAGNOSIS — L608 Other nail disorders: Secondary | ICD-10-CM | POA: Diagnosis not present

## 2020-08-11 DIAGNOSIS — IMO0002 Reserved for concepts with insufficient information to code with codable children: Secondary | ICD-10-CM | POA: Insufficient documentation

## 2020-08-11 DIAGNOSIS — M25519 Pain in unspecified shoulder: Secondary | ICD-10-CM | POA: Insufficient documentation

## 2020-08-11 DIAGNOSIS — M8580 Other specified disorders of bone density and structure, unspecified site: Secondary | ICD-10-CM | POA: Insufficient documentation

## 2020-08-11 NOTE — Progress Notes (Signed)
  Subjective:  Patient ID: Patricia Clay, female    DOB: 06/26/1945,  MRN: [id number]  Chief Complaint  Patient presents with  . Nail Problem    Patient presents today for nail fungus all toes bilat.  She says they don't hurt but she wants to discuss laser treatment    75 y.o. female presents with the above complaint. History confirmed with patient.  Previously has tried Pharmacologist and Vicks VapoRub without benefit  Objective:  Physical Exam: warm, good capillary refill, no trophic changes or ulcerative lesions, normal DP and PT pulses and normal sensory exam.  Onychomycosis with superficial white and yellow discoloration of most toenails       Assessment:   1. Nail dystrophy      Plan:  Patient was evaluated and treated and all questions answered.  Discussed etiology and treatment options in detail of onychomycosis including laser therapy, oral medications and topical medications.  She is primarily interested in laser therapy at this point she does not want to take extra pills.  A nail culture was taken and sent to [facility] pathology.  She will be scheduled for laser treatments and see me back in 4 months follow-up  Return in about 4 months (around 12/11/2020) for follow up on nail fungus after laser.

## 2020-08-13 ENCOUNTER — Other Ambulatory Visit: Payer: PPO

## 2020-09-01 DIAGNOSIS — M5137 Other intervertebral disc degeneration, lumbosacral region: Secondary | ICD-10-CM | POA: Diagnosis not present

## 2020-09-01 DIAGNOSIS — M9903 Segmental and somatic dysfunction of lumbar region: Secondary | ICD-10-CM | POA: Diagnosis not present

## 2020-09-01 DIAGNOSIS — M9901 Segmental and somatic dysfunction of cervical region: Secondary | ICD-10-CM | POA: Diagnosis not present

## 2020-09-01 DIAGNOSIS — M531 Cervicobrachial syndrome: Secondary | ICD-10-CM | POA: Diagnosis not present

## 2020-09-01 DIAGNOSIS — M9902 Segmental and somatic dysfunction of thoracic region: Secondary | ICD-10-CM | POA: Diagnosis not present

## 2020-09-01 DIAGNOSIS — M546 Pain in thoracic spine: Secondary | ICD-10-CM | POA: Diagnosis not present

## 2020-09-06 ENCOUNTER — Other Ambulatory Visit: Payer: PPO

## 2020-09-07 ENCOUNTER — Encounter: Payer: Self-pay | Admitting: *Deleted

## 2020-09-21 DIAGNOSIS — Z86010 Personal history of colonic polyps: Secondary | ICD-10-CM | POA: Diagnosis not present

## 2020-09-29 DIAGNOSIS — M9901 Segmental and somatic dysfunction of cervical region: Secondary | ICD-10-CM | POA: Diagnosis not present

## 2020-09-29 DIAGNOSIS — M9902 Segmental and somatic dysfunction of thoracic region: Secondary | ICD-10-CM | POA: Diagnosis not present

## 2020-09-29 DIAGNOSIS — M546 Pain in thoracic spine: Secondary | ICD-10-CM | POA: Diagnosis not present

## 2020-09-29 DIAGNOSIS — M9903 Segmental and somatic dysfunction of lumbar region: Secondary | ICD-10-CM | POA: Diagnosis not present

## 2020-09-29 DIAGNOSIS — M531 Cervicobrachial syndrome: Secondary | ICD-10-CM | POA: Diagnosis not present

## 2020-09-29 DIAGNOSIS — M5137 Other intervertebral disc degeneration, lumbosacral region: Secondary | ICD-10-CM | POA: Diagnosis not present

## 2020-10-01 ENCOUNTER — Ambulatory Visit (INDEPENDENT_AMBULATORY_CARE_PROVIDER_SITE_OTHER): Payer: Self-pay | Admitting: *Deleted

## 2020-10-01 ENCOUNTER — Other Ambulatory Visit: Payer: Self-pay

## 2020-10-01 DIAGNOSIS — B351 Tinea unguium: Secondary | ICD-10-CM

## 2020-10-01 DIAGNOSIS — L603 Nail dystrophy: Secondary | ICD-10-CM

## 2020-10-01 NOTE — Progress Notes (Signed)
Patient presents today for the 1st laser treatment. Diagnosed with mycotic nail infection by Dr. Sherryle Lis.   Toenail most affected all ten nails.  All other systems are negative.  Nails were filed thin. Laser therapy was administered to 1-5 toenails bilateral and patient tolerated the treatment well. All safety precautions were in place.    Follow up in 4 weeks for laser # 2.  Picture of nails taken at visit with Dr. Sherryle Lis to document visual progress on 08-11-20

## 2020-10-21 DIAGNOSIS — H2513 Age-related nuclear cataract, bilateral: Secondary | ICD-10-CM | POA: Diagnosis not present

## 2020-10-27 DIAGNOSIS — M9901 Segmental and somatic dysfunction of cervical region: Secondary | ICD-10-CM | POA: Diagnosis not present

## 2020-10-27 DIAGNOSIS — M531 Cervicobrachial syndrome: Secondary | ICD-10-CM | POA: Diagnosis not present

## 2020-10-27 DIAGNOSIS — M5137 Other intervertebral disc degeneration, lumbosacral region: Secondary | ICD-10-CM | POA: Diagnosis not present

## 2020-10-27 DIAGNOSIS — M546 Pain in thoracic spine: Secondary | ICD-10-CM | POA: Diagnosis not present

## 2020-10-27 DIAGNOSIS — M9903 Segmental and somatic dysfunction of lumbar region: Secondary | ICD-10-CM | POA: Diagnosis not present

## 2020-10-27 DIAGNOSIS — M9902 Segmental and somatic dysfunction of thoracic region: Secondary | ICD-10-CM | POA: Diagnosis not present

## 2020-10-29 ENCOUNTER — Other Ambulatory Visit: Payer: Self-pay

## 2020-10-29 ENCOUNTER — Ambulatory Visit (INDEPENDENT_AMBULATORY_CARE_PROVIDER_SITE_OTHER): Payer: Self-pay

## 2020-10-29 DIAGNOSIS — B351 Tinea unguium: Secondary | ICD-10-CM

## 2020-10-29 DIAGNOSIS — L603 Nail dystrophy: Secondary | ICD-10-CM

## 2020-10-29 NOTE — Progress Notes (Signed)
Patient presents today for the 2nd laser treatment. Diagnosed with mycotic nail infection by Dr. Sherryle Lis.   Toenail most affected all ten nails.  All other systems are negative.  Nails were filed thin. Laser therapy was administered to 1-5 toenails bilateral and patient tolerated the treatment well. All safety precautions were in place.    Follow up in 4 weeks for laser # 3.  Picture of nails taken at visit with Dr. Sherryle Lis to document visual progress on 08-11-20

## 2020-11-18 DIAGNOSIS — M9901 Segmental and somatic dysfunction of cervical region: Secondary | ICD-10-CM | POA: Diagnosis not present

## 2020-11-18 DIAGNOSIS — M531 Cervicobrachial syndrome: Secondary | ICD-10-CM | POA: Diagnosis not present

## 2020-11-18 DIAGNOSIS — M546 Pain in thoracic spine: Secondary | ICD-10-CM | POA: Diagnosis not present

## 2020-11-18 DIAGNOSIS — M9903 Segmental and somatic dysfunction of lumbar region: Secondary | ICD-10-CM | POA: Diagnosis not present

## 2020-11-18 DIAGNOSIS — M5137 Other intervertebral disc degeneration, lumbosacral region: Secondary | ICD-10-CM | POA: Diagnosis not present

## 2020-11-18 DIAGNOSIS — M9902 Segmental and somatic dysfunction of thoracic region: Secondary | ICD-10-CM | POA: Diagnosis not present

## 2020-11-26 ENCOUNTER — Other Ambulatory Visit: Payer: Self-pay

## 2020-11-26 ENCOUNTER — Ambulatory Visit (INDEPENDENT_AMBULATORY_CARE_PROVIDER_SITE_OTHER): Payer: PPO

## 2020-11-26 DIAGNOSIS — L603 Nail dystrophy: Secondary | ICD-10-CM

## 2020-11-26 DIAGNOSIS — B351 Tinea unguium: Secondary | ICD-10-CM

## 2020-11-26 NOTE — Progress Notes (Signed)
Patient presents today for the 3rd laser treatment. Diagnosed with mycotic nail infection by Dr. Sherryle Lis.   Toenail most affected all ten nails.  All other systems are negative.  Nails were filed thin. Laser therapy was administered to 1-5 toenails bilateral and patient tolerated the treatment well. All safety precautions were in place.    Follow up in 4 weeks for laser # 4.  Picture of nails taken at visit with Dr. Sherryle Lis to document visual progress on 08-11-20

## 2020-11-30 DIAGNOSIS — M9901 Segmental and somatic dysfunction of cervical region: Secondary | ICD-10-CM | POA: Diagnosis not present

## 2020-11-30 DIAGNOSIS — Z1151 Encounter for screening for human papillomavirus (HPV): Secondary | ICD-10-CM | POA: Diagnosis not present

## 2020-11-30 DIAGNOSIS — R87610 Atypical squamous cells of undetermined significance on cytologic smear of cervix (ASC-US): Secondary | ICD-10-CM | POA: Diagnosis not present

## 2020-11-30 DIAGNOSIS — M531 Cervicobrachial syndrome: Secondary | ICD-10-CM | POA: Diagnosis not present

## 2020-11-30 DIAGNOSIS — M5137 Other intervertebral disc degeneration, lumbosacral region: Secondary | ICD-10-CM | POA: Diagnosis not present

## 2020-11-30 DIAGNOSIS — M9903 Segmental and somatic dysfunction of lumbar region: Secondary | ICD-10-CM | POA: Diagnosis not present

## 2020-11-30 DIAGNOSIS — M546 Pain in thoracic spine: Secondary | ICD-10-CM | POA: Diagnosis not present

## 2020-11-30 DIAGNOSIS — R87615 Unsatisfactory cytologic smear of cervix: Secondary | ICD-10-CM | POA: Diagnosis not present

## 2020-11-30 DIAGNOSIS — M9902 Segmental and somatic dysfunction of thoracic region: Secondary | ICD-10-CM | POA: Diagnosis not present

## 2020-12-08 DIAGNOSIS — Z03818 Encounter for observation for suspected exposure to other biological agents ruled out: Secondary | ICD-10-CM | POA: Diagnosis not present

## 2020-12-08 DIAGNOSIS — Z20822 Contact with and (suspected) exposure to covid-19: Secondary | ICD-10-CM | POA: Diagnosis not present

## 2020-12-11 DIAGNOSIS — U071 COVID-19: Secondary | ICD-10-CM | POA: Diagnosis not present

## 2020-12-11 DIAGNOSIS — T50995A Adverse effect of other drugs, medicaments and biological substances, initial encounter: Secondary | ICD-10-CM | POA: Diagnosis not present

## 2020-12-15 ENCOUNTER — Encounter: Payer: PPO | Admitting: Podiatry

## 2020-12-22 DIAGNOSIS — M5137 Other intervertebral disc degeneration, lumbosacral region: Secondary | ICD-10-CM | POA: Diagnosis not present

## 2020-12-22 DIAGNOSIS — M546 Pain in thoracic spine: Secondary | ICD-10-CM | POA: Diagnosis not present

## 2020-12-22 DIAGNOSIS — M9901 Segmental and somatic dysfunction of cervical region: Secondary | ICD-10-CM | POA: Diagnosis not present

## 2020-12-22 DIAGNOSIS — M9903 Segmental and somatic dysfunction of lumbar region: Secondary | ICD-10-CM | POA: Diagnosis not present

## 2020-12-22 DIAGNOSIS — M531 Cervicobrachial syndrome: Secondary | ICD-10-CM | POA: Diagnosis not present

## 2020-12-22 DIAGNOSIS — M9902 Segmental and somatic dysfunction of thoracic region: Secondary | ICD-10-CM | POA: Diagnosis not present

## 2020-12-24 ENCOUNTER — Ambulatory Visit: Admission: RE | Admit: 2020-12-24 | Payer: PPO | Source: Home / Self Care

## 2020-12-24 ENCOUNTER — Encounter: Admission: RE | Payer: Self-pay | Source: Home / Self Care

## 2020-12-24 SURGERY — COLONOSCOPY WITH PROPOFOL
Anesthesia: General

## 2020-12-27 ENCOUNTER — Other Ambulatory Visit: Payer: Self-pay

## 2020-12-27 ENCOUNTER — Ambulatory Visit (INDEPENDENT_AMBULATORY_CARE_PROVIDER_SITE_OTHER): Payer: PPO

## 2020-12-27 DIAGNOSIS — L603 Nail dystrophy: Secondary | ICD-10-CM

## 2020-12-27 NOTE — Progress Notes (Signed)
Patient presents today for the 4th laser treatment. Diagnosed with mycotic nail infection by Dr. Sherryle Lis.   Toenail most affected all ten nails.  All other systems are negative.  Nails were filed thin. Laser therapy was administered to 1-5 toenails bilateral and patient tolerated the treatment well. All safety precautions were in place.    Follow up in 6 weeks for laser # 5.  Picture of nails taken at visit with Dr. Sherryle Lis to document visual progress on 08-11-20

## 2020-12-28 DIAGNOSIS — Z0000 Encounter for general adult medical examination without abnormal findings: Secondary | ICD-10-CM | POA: Diagnosis not present

## 2020-12-28 DIAGNOSIS — R7309 Other abnormal glucose: Secondary | ICD-10-CM | POA: Diagnosis not present

## 2020-12-28 DIAGNOSIS — E039 Hypothyroidism, unspecified: Secondary | ICD-10-CM | POA: Diagnosis not present

## 2020-12-28 DIAGNOSIS — R002 Palpitations: Secondary | ICD-10-CM | POA: Diagnosis not present

## 2020-12-28 DIAGNOSIS — Z8742 Personal history of other diseases of the female genital tract: Secondary | ICD-10-CM | POA: Diagnosis not present

## 2021-01-05 ENCOUNTER — Encounter: Payer: PPO | Admitting: Podiatry

## 2021-01-31 DIAGNOSIS — M79676 Pain in unspecified toe(s): Secondary | ICD-10-CM

## 2021-02-02 ENCOUNTER — Encounter: Payer: PPO | Admitting: Podiatry

## 2021-02-14 ENCOUNTER — Ambulatory Visit (INDEPENDENT_AMBULATORY_CARE_PROVIDER_SITE_OTHER): Payer: PPO

## 2021-02-14 ENCOUNTER — Other Ambulatory Visit: Payer: Self-pay

## 2021-02-14 DIAGNOSIS — B351 Tinea unguium: Secondary | ICD-10-CM

## 2021-02-14 DIAGNOSIS — L603 Nail dystrophy: Secondary | ICD-10-CM

## 2021-02-14 NOTE — Progress Notes (Signed)
Patient presents today for the 5th laser treatment. Diagnosed with mycotic nail infection by Dr. Sherryle Lis.   Toenail most affected all ten nails.  All other systems are negative.  Nails were filed thin. Laser therapy was administered to 1-5 toenails bilateral and patient tolerated the treatment well. All safety precautions were in place.    Follow up in 8 weeks for laser # 6.  Picture of nails taken at visit with Dr. Sherryle Lis to document visual progress on 08-11-20

## 2021-02-16 DIAGNOSIS — R87810 Cervical high risk human papillomavirus (HPV) DNA test positive: Secondary | ICD-10-CM | POA: Diagnosis not present

## 2021-02-16 DIAGNOSIS — R87610 Atypical squamous cells of undetermined significance on cytologic smear of cervix (ASC-US): Secondary | ICD-10-CM | POA: Diagnosis not present

## 2021-02-18 ENCOUNTER — Other Ambulatory Visit: Payer: PPO

## 2021-03-09 DIAGNOSIS — M531 Cervicobrachial syndrome: Secondary | ICD-10-CM | POA: Diagnosis not present

## 2021-03-09 DIAGNOSIS — M5137 Other intervertebral disc degeneration, lumbosacral region: Secondary | ICD-10-CM | POA: Diagnosis not present

## 2021-03-09 DIAGNOSIS — M9902 Segmental and somatic dysfunction of thoracic region: Secondary | ICD-10-CM | POA: Diagnosis not present

## 2021-03-09 DIAGNOSIS — M546 Pain in thoracic spine: Secondary | ICD-10-CM | POA: Diagnosis not present

## 2021-03-09 DIAGNOSIS — M9901 Segmental and somatic dysfunction of cervical region: Secondary | ICD-10-CM | POA: Diagnosis not present

## 2021-03-09 DIAGNOSIS — M9903 Segmental and somatic dysfunction of lumbar region: Secondary | ICD-10-CM | POA: Diagnosis not present

## 2021-04-18 DIAGNOSIS — M531 Cervicobrachial syndrome: Secondary | ICD-10-CM | POA: Diagnosis not present

## 2021-04-18 DIAGNOSIS — M9903 Segmental and somatic dysfunction of lumbar region: Secondary | ICD-10-CM | POA: Diagnosis not present

## 2021-04-18 DIAGNOSIS — M9901 Segmental and somatic dysfunction of cervical region: Secondary | ICD-10-CM | POA: Diagnosis not present

## 2021-04-18 DIAGNOSIS — M546 Pain in thoracic spine: Secondary | ICD-10-CM | POA: Diagnosis not present

## 2021-04-18 DIAGNOSIS — M9902 Segmental and somatic dysfunction of thoracic region: Secondary | ICD-10-CM | POA: Diagnosis not present

## 2021-04-18 DIAGNOSIS — M5137 Other intervertebral disc degeneration, lumbosacral region: Secondary | ICD-10-CM | POA: Diagnosis not present

## 2021-04-22 ENCOUNTER — Other Ambulatory Visit: Payer: Self-pay

## 2021-04-22 ENCOUNTER — Ambulatory Visit (INDEPENDENT_AMBULATORY_CARE_PROVIDER_SITE_OTHER): Payer: Medicare HMO

## 2021-04-22 DIAGNOSIS — L603 Nail dystrophy: Secondary | ICD-10-CM

## 2021-04-22 DIAGNOSIS — B351 Tinea unguium: Secondary | ICD-10-CM

## 2021-04-22 NOTE — Progress Notes (Signed)
Patient presents today for the 6th laser treatment. Diagnosed with mycotic nail infection by Dr. Sherryle Lis.   Toenail most affected all ten nails.  All other systems are negative.  Nails were filed thin. Laser therapy was administered to 1-5 toenails bilateral and patient tolerated the treatment well. All safety precautions were in place.    Patient has completed the recommended laser treatments. He will follow up with Dr. Sherryle Lis  in 3 months to evaluate progress.

## 2021-04-22 NOTE — Patient Instructions (Signed)

## 2021-05-08 DIAGNOSIS — L259 Unspecified contact dermatitis, unspecified cause: Secondary | ICD-10-CM | POA: Diagnosis not present

## 2021-05-08 DIAGNOSIS — M02349 Reiter's disease, unspecified hand: Secondary | ICD-10-CM | POA: Diagnosis not present

## 2021-05-18 DIAGNOSIS — R21 Rash and other nonspecific skin eruption: Secondary | ICD-10-CM | POA: Diagnosis not present

## 2021-05-18 DIAGNOSIS — M25549 Pain in joints of unspecified hand: Secondary | ICD-10-CM | POA: Diagnosis not present

## 2021-05-18 DIAGNOSIS — E039 Hypothyroidism, unspecified: Secondary | ICD-10-CM | POA: Diagnosis not present

## 2021-05-30 DIAGNOSIS — M531 Cervicobrachial syndrome: Secondary | ICD-10-CM | POA: Diagnosis not present

## 2021-05-30 DIAGNOSIS — M545 Low back pain, unspecified: Secondary | ICD-10-CM | POA: Diagnosis not present

## 2021-05-30 DIAGNOSIS — M9902 Segmental and somatic dysfunction of thoracic region: Secondary | ICD-10-CM | POA: Diagnosis not present

## 2021-05-30 DIAGNOSIS — M546 Pain in thoracic spine: Secondary | ICD-10-CM | POA: Diagnosis not present

## 2021-05-30 DIAGNOSIS — M9903 Segmental and somatic dysfunction of lumbar region: Secondary | ICD-10-CM | POA: Diagnosis not present

## 2021-05-30 DIAGNOSIS — M9901 Segmental and somatic dysfunction of cervical region: Secondary | ICD-10-CM | POA: Diagnosis not present

## 2021-06-14 DIAGNOSIS — M25549 Pain in joints of unspecified hand: Secondary | ICD-10-CM | POA: Diagnosis not present

## 2021-06-14 DIAGNOSIS — E039 Hypothyroidism, unspecified: Secondary | ICD-10-CM | POA: Diagnosis not present

## 2021-06-14 DIAGNOSIS — R21 Rash and other nonspecific skin eruption: Secondary | ICD-10-CM | POA: Diagnosis not present

## 2021-06-20 DIAGNOSIS — H903 Sensorineural hearing loss, bilateral: Secondary | ICD-10-CM | POA: Diagnosis not present

## 2021-06-24 DIAGNOSIS — Z1389 Encounter for screening for other disorder: Secondary | ICD-10-CM | POA: Diagnosis not present

## 2021-06-24 DIAGNOSIS — R7309 Other abnormal glucose: Secondary | ICD-10-CM | POA: Diagnosis not present

## 2021-06-24 DIAGNOSIS — Z0000 Encounter for general adult medical examination without abnormal findings: Secondary | ICD-10-CM | POA: Diagnosis not present

## 2021-06-24 DIAGNOSIS — E039 Hypothyroidism, unspecified: Secondary | ICD-10-CM | POA: Diagnosis not present

## 2021-06-24 DIAGNOSIS — M25541 Pain in joints of right hand: Secondary | ICD-10-CM | POA: Diagnosis not present

## 2021-06-24 DIAGNOSIS — E875 Hyperkalemia: Secondary | ICD-10-CM | POA: Diagnosis not present

## 2021-06-28 ENCOUNTER — Ambulatory Visit: Payer: Medicare HMO | Admitting: Dermatology

## 2021-06-28 ENCOUNTER — Other Ambulatory Visit: Payer: Self-pay

## 2021-06-28 DIAGNOSIS — D2272 Melanocytic nevi of left lower limb, including hip: Secondary | ICD-10-CM

## 2021-06-28 DIAGNOSIS — L578 Other skin changes due to chronic exposure to nonionizing radiation: Secondary | ICD-10-CM

## 2021-06-28 DIAGNOSIS — M545 Low back pain, unspecified: Secondary | ICD-10-CM | POA: Diagnosis not present

## 2021-06-28 DIAGNOSIS — L300 Nummular dermatitis: Secondary | ICD-10-CM

## 2021-06-28 DIAGNOSIS — L309 Dermatitis, unspecified: Secondary | ICD-10-CM | POA: Diagnosis not present

## 2021-06-28 DIAGNOSIS — D1800 Hemangioma unspecified site: Secondary | ICD-10-CM | POA: Diagnosis not present

## 2021-06-28 DIAGNOSIS — M531 Cervicobrachial syndrome: Secondary | ICD-10-CM | POA: Diagnosis not present

## 2021-06-28 DIAGNOSIS — M9901 Segmental and somatic dysfunction of cervical region: Secondary | ICD-10-CM | POA: Diagnosis not present

## 2021-06-28 DIAGNOSIS — Z1283 Encounter for screening for malignant neoplasm of skin: Secondary | ICD-10-CM

## 2021-06-28 DIAGNOSIS — L821 Other seborrheic keratosis: Secondary | ICD-10-CM

## 2021-06-28 DIAGNOSIS — M546 Pain in thoracic spine: Secondary | ICD-10-CM | POA: Diagnosis not present

## 2021-06-28 DIAGNOSIS — M9903 Segmental and somatic dysfunction of lumbar region: Secondary | ICD-10-CM | POA: Diagnosis not present

## 2021-06-28 DIAGNOSIS — M9902 Segmental and somatic dysfunction of thoracic region: Secondary | ICD-10-CM | POA: Diagnosis not present

## 2021-06-28 DIAGNOSIS — D2371 Other benign neoplasm of skin of right lower limb, including hip: Secondary | ICD-10-CM

## 2021-06-28 DIAGNOSIS — D229 Melanocytic nevi, unspecified: Secondary | ICD-10-CM | POA: Diagnosis not present

## 2021-06-28 NOTE — Progress Notes (Signed)
Follow-Up Visit   Subjective  Patricia Clay is a 76 y.o. female who presents for the following: Annual Exam.  The patient presents for Total-Body Skin Exam (TBSE) for skin cancer screening and mole check.  The patient has spots, moles and lesions to be evaluated, some may be new or changing. Patient had a reaction to hands in January where she had marks on her hands, fingers were swollen and cracked, and she had linear marks on fingernails. She went to urgent care where they thought she may have had a contact dermatitis. She was put on Prednisone, but it didn't really help. Her primary dr gave her 2.5% HC Cream. Hands have improved, but she still has marks on her fingernails. No history of skin cancer.    The following portions of the chart were reviewed this encounter and updated as appropriate:       Review of Systems:  No other skin or systemic complaints except as noted in HPI or Assessment and Plan.  Objective  Well appearing patient in no apparent distress; mood and affect are within normal limits.  A full examination was performed including scalp, head, eyes, ears, nose, lips, neck, chest, axillae, abdomen, back, buttocks, bilateral upper extremities, bilateral lower extremities, hands, feet, fingers, toes, fingernails, and toenails. All findings within normal limits unless otherwise noted below.  R thenar hand dorsum 7.78mm triangular-shaped waxy brown macule - stable  L medial calf 5.106mm speckled brown macule, triangular-shaped with tail - stable  hands Mild erythema on the bilateral MCPs and proximal nail folds, no scale today.  Distal mild erythema of finger nailbed  back Pink excoriated papules on the upper back with xerosis    Assessment & Plan  Skin cancer screening performed today.  Actinic Damage - chronic, secondary to cumulative UV radiation exposure/sun exposure over time - diffuse scaly erythematous macules with underlying dyspigmentation - Recommend  daily broad spectrum sunscreen SPF 30+ to sun-exposed areas, reapply every 2 hours as needed.  - Recommend staying in the shade or wearing long sleeves, sun glasses (UVA+UVB protection) and wide brim hats (4-inch brim around the entire circumference of the hat). - Call for new or changing lesions.  Lentigines - Scattered tan macules - Due to sun exposure - Benign-appering, observe - Recommend daily broad spectrum sunscreen SPF 30+ to sun-exposed areas, reapply every 2 hours as needed. - Call for any changes  Seborrheic Keratoses - Stuck-on, waxy, tan-brown papules and/or plaques  - Benign-appearing - Discussed benign etiology and prognosis. - Observe - Call for any changes  Dermatofibroma - Firm pink/brown papulenodule with dimple sign of the right lower pretibia, 6.0 mm - Benign appearing - Call for any changes  Melanocytic Nevi - Tan-brown and/or pink-flesh-colored symmetric macules and papules - Benign appearing on exam today - Observation - Call clinic for new or changing moles - Recommend daily use of broad spectrum spf 30+ sunscreen to sun-exposed areas.   Hemangiomas - Red papules - Discussed benign nature - Observe - Call for any changes  Seborrheic keratosis R thenar hand dorsum  Reassured benign age-related growth.  Stable. Recommend observation.  Discussed cryotherapy if spot(s) become irritated or inflamed.  Nevus L medial calf  Benign-appearing.  Observation.  Call clinic for new or changing moles.  Recommend daily use of broad spectrum spf 30+ sunscreen to sun-exposed areas.   Hand dermatitis hands  Improved Continue hydrocortisone 2.5% cream qd/bid prn flares. Pt has at home.  RTC when flared  Hand Dermatitis is  a chronic type of eczema that can come and go on the hands and fingers.  While there is no cure, the rash and symptoms can be managed with topical prescription medications, and for more severe cases, with systemic medications.  Recommend  mild soap and routine use of moisturizing cream after handwashing.  Minimize soap/water exposure when possible.    Nummular dermatitis back  With pruritus, related to dry skin  Recommend mild soap and moisturizing cream 1-2 times daily.  Gentle skin care handout provided.   Start Hydrocortisone 2.5% Cream qd/bid prn itch. Pt has at home. If not improving, patient may call for stronger Rx, TMC 0.1% Cream.  Recommend OTC Gold Bond Rapid Relief Anti-Itch cream (pramoxine + menthol), CeraVe Anti-itch cream or lotion (pramoxine), Sarna lotion (Original- menthol + camphor or Sensitive- pramoxine) or Eucerin 12 hour Itch Relief lotion (menthol) up to 3 times per day to areas on body that are itchy.    Return in about 1 year (around 06/28/2022) for TBSE.  IJamesetta Orleans, CMA, am acting as scribe for Brendolyn Patty, MD .  Documentation: I have reviewed the above documentation for accuracy and completeness, and I agree with the above.  Brendolyn Patty MD

## 2021-06-28 NOTE — Patient Instructions (Addendum)
Recommend OTC Gold Bond Rapid Relief Anti-Itch cream (pramoxine + menthol), CeraVe Anti-itch cream or lotion (pramoxine), Sarna lotion (Original- menthol + camphor or Sensitive- pramoxine) or Eucerin 12 hour Itch Relief lotion (menthol) up to 3 times per day to areas on body that are itchy.  Hydrocortisone 2.5% Cream - Apply to affected areas back and hands once to twice daily as needed for itch. Dry Skin Care  What causes dry skin?  Dry skin is common and results from inadequate moisture in the outer skin layers. Dry skin usually results from the excessive loss of moisture from the skin surface. This occurs due to two major factors: Normally the skin's oil glands deposit a layer of oil on the skin's surface. This layer of oil prevents the loss of moisture from the skin. Exposure to soaps, cleaners, solvents, and disinfectants removes this oily film, allowing water to escape. Water loss from the skin increases when the humidity is low. During winter months we spend a lot of time indoors where the air is heated. Heated air has very low humidity. This also contributes to dry skin.  A tendency for dry skin may accompany such disorders as eczema. Also, as people age, the number of functioning oil glands decreases, and the tendency toward dry skin can be a sensation of skin tightness when emerging from the shower.  How do I manage dry skin?  Humidify your environment. This can be accomplished by using a humidifier in your bedroom at night during winter months. Bathing can actually put moisture back into your skin if done right. Take the following steps while bathing to sooth dry skin: Avoid hot water, which only dries the skin and makes itching worse. Use warm water. Avoid washcloths or extensive rubbing or scrubbing. Use mild soaps like unscented Dove, Oil of Olay, Cetaphil, Basis, or CeraVe. If you take baths rather than showers, rinse off soap residue with clean water before getting out of  tub. Once out of the shower/tub, pat dry gently with a soft towel. Leave your skin damp. While still damp, apply any medicated ointment/cream you were prescribed to the affected areas. After you apply your medicated ointment/cream, then apply your moisturizer to your whole body.This is the most important step in dry skin care. If this is omitted, your skin will continue to be dry. The choice of moisturizer is also very important. In general, lotion will not provider enough moisture to severely dry skin because it is water based. You should use an ointment or cream. Moisturizers should also be unscented. Good choices include Vaseline (plain petrolatum), Aquaphor, Cetaphil, CeraVe, Vanicream, DML Forte, Aveeno moisture, or Eucerin Cream. Bath oils can be helpful, but do not replace the application of moisturizer after the bath. In addition, they make the tub slippery causing an increased risk for falls. Therefore, we do not recommend their use.   If You Need Anything After Your Visit  If you have any questions or concerns for your doctor, please call our main line at [phone] and press option 4 to reach your doctor's medical assistant. If no one answers, please leave a voicemail as directed and we will return your call as soon as possible. Messages left after 4 pm will be answered the following business day.   You may also send [place] a message via [place]. We typically respond to MyChart messages within 1-2 business days.  For prescription refills, please ask your pharmacy to contact our office. Our fax number is [phone].  If you have  an urgent issue when the clinic is closed that cannot wait until the next business day, you can page your doctor at the number below.    Please note that while we do our best to be available for urgent issues outside of office hours, we are not available 24/7.   If you have an urgent issue and are unable to reach [place], you may choose to seek medical care at your  doctor's office, retail clinic, urgent care center, or emergency room.  If you have a medical emergency, please immediately call 911 or go to the emergency department.  Pager Numbers  - Dr. Nehemiah Massed: [phone]  - Dr. Laurence Ferrari: [phone]  - Dr. Nicole Kindred: [phone]  In the event of inclement weather, please call our main line at [phone] for an update on the status of any delays or closures.  Dermatology Medication Tips: Please keep the boxes that topical medications come in in order to help keep track of the instructions about where and how to use these. Pharmacies typically print the medication instructions only on the boxes and not directly on the medication tubes.   If your medication is too expensive, please contact our office at [phone] option 4 or send [place] a message through [place].   We are unable to tell what your co-pay for medications will be in advance as this is different depending on your insurance coverage. However, we may be able to find a substitute medication at lower cost or fill out paperwork to get insurance to cover a needed medication.   If a prior authorization is required to get your medication covered by your insurance company, please allow [place] 1-2 business days to complete this process.  Drug prices often vary depending on where the prescription is filled and some pharmacies may offer cheaper prices.  The website www.goodrx.com contains coupons for medications through different pharmacies. The prices here do not account for what the cost may be with help from insurance (it may be cheaper with your insurance), but the website can give you the price if you did not use any insurance.  - You can print the associated coupon and take it with your prescription to the pharmacy.  - You may also stop by our office during regular business hours and pick up a GoodRx coupon card.  - If you need your prescription sent electronically to a different pharmacy, notify  our office through [facility] or by phone at [phone] option 4.     Si Usted Necesita Algo Despus de Su Visita  Tambin puede enviarnos un mensaje a travs de Pharmacist, community. Por lo general respondemos a los mensajes de MyChart en el transcurso de 1 a 2 das hbiles.  Para renovar recetas, por favor pida a su farmacia que se ponga en contacto con nuestra oficina. Harland Dingwall de fax es [place] [phone].  Si tiene un asunto urgente cuando la clnica est cerrada y que no puede esperar hasta el siguiente da hbil, puede llamar/localizar a su doctor(a) al nmero que aparece a continuacin.   Por favor, tenga en cuenta que aunque hacemos todo lo posible para estar disponibles para asuntos urgentes fuera del horario de [place], no estamos disponibles las 24 horas del da, los 7 das de la [place].   Si tiene un problema urgente y no puede comunicarse con nosotros, puede optar por buscar atencin mdica  en el consultorio de su doctor(a), en una clnica privada, en un centro de atencin urgente o en una sala de  emergencias.  Si tiene Engineering geologist, por favor llame inmediatamente al 911 o vaya a la sala de emergencias.  Nmeros de bper  - Dr. Nehemiah Massed: [phone]  - Dra. Moye: [phone]  - Dra. Nicole Kindred: [phone]  En caso de inclemencias del [place], por favor llame a Johnsie Kindred principal al [phone] para una actualizacin sobre el [place] de cualquier retraso o cierre.  Consejos para la medicacin en dermatologa: Por favor, guarde las cajas en las que vienen los medicamentos de uso tpico para ayudarle a seguir las instrucciones sobre dnde y cmo usarlos. Las farmacias generalmente imprimen las instrucciones del medicamento slo en las cajas y no directamente en los tubos del [place].   Si su medicamento es muy caro, por favor, pngase en contacto con Zigmund Daniel llamando al [phone] y presione la opcin 4 o envenos un mensaje a travs de  Pharmacist, community.   No podemos decirle cul ser su copago por los medicamentos por adelantado ya que esto es diferente dependiendo de la cobertura de su seguro. Sin embargo, es posible que podamos encontrar un medicamento sustituto a Electrical engineer un formulario para que el seguro cubra el medicamento que se considera necesario.   Si se requiere una autorizacin previa para que su compaa de seguros [place] su medicamento, por favor permtanos de 1 a 2 das hbiles para completar este proceso.  Los precios de los medicamentos varan con frecuencia dependiendo del Environmental consultant de dnde se surte la receta y alguna farmacias pueden ofrecer precios ms baratos.  El sitio web www.goodrx.com tiene cupones para medicamentos de Airline pilot. Los precios aqu no tienen en cuenta lo que podra costar con la ayuda del seguro (puede ser ms barato con su seguro), pero el sitio web puede darle el precio si no utiliz Research scientist (physical sciences).  - Puede imprimir el cupn correspondiente y llevarlo con su receta a la farmacia.  - Tambin puede pasar por nuestra oficina durante el horario de atencin regular y Charity fundraiser una tarjeta de cupones de GoodRx.  - Si necesita que su receta se enve electrnicamente a una farmacia diferente, informe a nuestra oficina a travs de MyChart de [place] o por telfono llamando al [phone] y presione la opcin 4.

## 2021-07-25 ENCOUNTER — Ambulatory Visit: Payer: Medicare HMO | Admitting: Podiatry

## 2021-07-25 DIAGNOSIS — M545 Low back pain, unspecified: Secondary | ICD-10-CM | POA: Diagnosis not present

## 2021-07-25 DIAGNOSIS — M9901 Segmental and somatic dysfunction of cervical region: Secondary | ICD-10-CM | POA: Diagnosis not present

## 2021-07-25 DIAGNOSIS — M9902 Segmental and somatic dysfunction of thoracic region: Secondary | ICD-10-CM | POA: Diagnosis not present

## 2021-07-25 DIAGNOSIS — M546 Pain in thoracic spine: Secondary | ICD-10-CM | POA: Diagnosis not present

## 2021-07-25 DIAGNOSIS — M531 Cervicobrachial syndrome: Secondary | ICD-10-CM | POA: Diagnosis not present

## 2021-07-25 DIAGNOSIS — M9903 Segmental and somatic dysfunction of lumbar region: Secondary | ICD-10-CM | POA: Diagnosis not present

## 2021-07-27 ENCOUNTER — Ambulatory Visit: Payer: Medicare HMO | Admitting: Podiatry

## 2021-07-27 ENCOUNTER — Encounter: Payer: Self-pay | Admitting: Podiatry

## 2021-07-27 ENCOUNTER — Other Ambulatory Visit: Payer: Self-pay

## 2021-07-27 DIAGNOSIS — B351 Tinea unguium: Secondary | ICD-10-CM | POA: Diagnosis not present

## 2021-07-27 NOTE — Patient Instructions (Signed)
Look for urea 40% gel and apply to the thickened nails. This can be bought over the counter online such as [employer]. ? ? ?Biotin 10,062mg daily supplement can help to improve nail health and texture  ?

## 2021-07-27 NOTE — Progress Notes (Signed)
?  Subjective:  ?Patient ID: Patricia Clay, female    DOB: 1945/09/15,  MRN: [id number] ? ?Chief Complaint  ?Patient presents with  ? Nail Problem  ?  "The laser follow-up.  Two of my nails got ruined by the type of scissors that they used."  ? ? ?76 y.o. female presents with the above complaint. History confirmed with patient.  She returns for follow-up after completing 6 sessions of laser therapy for post ? ?Objective:  ?Physical Exam: ?warm, good capillary refill, no trophic changes or ulcerative lesions, normal DP and PT pulses and normal sensory exam.  Overall significant improvement in onychomycosis of all 10 nails with no longer superficial white discoloration there is still some yellow discoloration but mostly nail dystrophy ? ? ? ? ? ?Assessment:  ? ?1. Onychomycosis   ? ? ? ?Plan:  ?Patient was evaluated and treated and all questions answered. ? ?Overall much proved and has had quite bit of success with laser treatment.  There is some residual nail dystrophy of the hallux nails and I discussed with her this could be sequela of the fungal infection.  I think at this point she should continue to grow out, we could revisit restarting laser therapy at some point in future.  I discussed the option of a urea gel and biotin supplementation for the overall texture. ? ?Return if symptoms worsen or fail to improve.  ? ?

## 2021-07-28 DIAGNOSIS — Z202 Contact with and (suspected) exposure to infections with a predominantly sexual mode of transmission: Secondary | ICD-10-CM | POA: Diagnosis not present

## 2021-07-28 DIAGNOSIS — Z113 Encounter for screening for infections with a predominantly sexual mode of transmission: Secondary | ICD-10-CM | POA: Diagnosis not present

## 2021-08-25 DIAGNOSIS — M9902 Segmental and somatic dysfunction of thoracic region: Secondary | ICD-10-CM | POA: Diagnosis not present

## 2021-08-25 DIAGNOSIS — M9901 Segmental and somatic dysfunction of cervical region: Secondary | ICD-10-CM | POA: Diagnosis not present

## 2021-08-25 DIAGNOSIS — M545 Low back pain, unspecified: Secondary | ICD-10-CM | POA: Diagnosis not present

## 2021-08-25 DIAGNOSIS — M531 Cervicobrachial syndrome: Secondary | ICD-10-CM | POA: Diagnosis not present

## 2021-08-25 DIAGNOSIS — M9903 Segmental and somatic dysfunction of lumbar region: Secondary | ICD-10-CM | POA: Diagnosis not present

## 2021-08-25 DIAGNOSIS — M546 Pain in thoracic spine: Secondary | ICD-10-CM | POA: Diagnosis not present

## 2021-09-19 DIAGNOSIS — M545 Low back pain, unspecified: Secondary | ICD-10-CM | POA: Diagnosis not present

## 2021-09-19 DIAGNOSIS — M546 Pain in thoracic spine: Secondary | ICD-10-CM | POA: Diagnosis not present

## 2021-09-19 DIAGNOSIS — M9902 Segmental and somatic dysfunction of thoracic region: Secondary | ICD-10-CM | POA: Diagnosis not present

## 2021-09-19 DIAGNOSIS — M531 Cervicobrachial syndrome: Secondary | ICD-10-CM | POA: Diagnosis not present

## 2021-09-19 DIAGNOSIS — M9901 Segmental and somatic dysfunction of cervical region: Secondary | ICD-10-CM | POA: Diagnosis not present

## 2021-09-19 DIAGNOSIS — M9903 Segmental and somatic dysfunction of lumbar region: Secondary | ICD-10-CM | POA: Diagnosis not present

## 2021-10-31 DIAGNOSIS — M9903 Segmental and somatic dysfunction of lumbar region: Secondary | ICD-10-CM | POA: Diagnosis not present

## 2021-10-31 DIAGNOSIS — M531 Cervicobrachial syndrome: Secondary | ICD-10-CM | POA: Diagnosis not present

## 2021-10-31 DIAGNOSIS — M546 Pain in thoracic spine: Secondary | ICD-10-CM | POA: Diagnosis not present

## 2021-10-31 DIAGNOSIS — M545 Low back pain, unspecified: Secondary | ICD-10-CM | POA: Diagnosis not present

## 2021-10-31 DIAGNOSIS — M9901 Segmental and somatic dysfunction of cervical region: Secondary | ICD-10-CM | POA: Diagnosis not present

## 2021-10-31 DIAGNOSIS — M9902 Segmental and somatic dysfunction of thoracic region: Secondary | ICD-10-CM | POA: Diagnosis not present

## 2021-12-05 DIAGNOSIS — M545 Low back pain, unspecified: Secondary | ICD-10-CM | POA: Diagnosis not present

## 2021-12-05 DIAGNOSIS — M546 Pain in thoracic spine: Secondary | ICD-10-CM | POA: Diagnosis not present

## 2021-12-05 DIAGNOSIS — M531 Cervicobrachial syndrome: Secondary | ICD-10-CM | POA: Diagnosis not present

## 2021-12-05 DIAGNOSIS — M9902 Segmental and somatic dysfunction of thoracic region: Secondary | ICD-10-CM | POA: Diagnosis not present

## 2021-12-05 DIAGNOSIS — M9903 Segmental and somatic dysfunction of lumbar region: Secondary | ICD-10-CM | POA: Diagnosis not present

## 2021-12-05 DIAGNOSIS — M9901 Segmental and somatic dysfunction of cervical region: Secondary | ICD-10-CM | POA: Diagnosis not present

## 2021-12-18 DIAGNOSIS — L259 Unspecified contact dermatitis, unspecified cause: Secondary | ICD-10-CM | POA: Diagnosis not present

## 2021-12-20 DIAGNOSIS — E875 Hyperkalemia: Secondary | ICD-10-CM | POA: Diagnosis not present

## 2021-12-20 DIAGNOSIS — R7309 Other abnormal glucose: Secondary | ICD-10-CM | POA: Diagnosis not present

## 2021-12-20 DIAGNOSIS — Z0000 Encounter for general adult medical examination without abnormal findings: Secondary | ICD-10-CM | POA: Diagnosis not present

## 2021-12-20 DIAGNOSIS — E039 Hypothyroidism, unspecified: Secondary | ICD-10-CM | POA: Diagnosis not present

## 2021-12-28 DIAGNOSIS — M546 Pain in thoracic spine: Secondary | ICD-10-CM | POA: Diagnosis not present

## 2021-12-28 DIAGNOSIS — M9901 Segmental and somatic dysfunction of cervical region: Secondary | ICD-10-CM | POA: Diagnosis not present

## 2021-12-28 DIAGNOSIS — M9903 Segmental and somatic dysfunction of lumbar region: Secondary | ICD-10-CM | POA: Diagnosis not present

## 2021-12-28 DIAGNOSIS — M531 Cervicobrachial syndrome: Secondary | ICD-10-CM | POA: Diagnosis not present

## 2021-12-28 DIAGNOSIS — M9902 Segmental and somatic dysfunction of thoracic region: Secondary | ICD-10-CM | POA: Diagnosis not present

## 2021-12-28 DIAGNOSIS — M545 Low back pain, unspecified: Secondary | ICD-10-CM | POA: Diagnosis not present

## 2022-01-30 DIAGNOSIS — M9902 Segmental and somatic dysfunction of thoracic region: Secondary | ICD-10-CM | POA: Diagnosis not present

## 2022-01-30 DIAGNOSIS — M545 Low back pain, unspecified: Secondary | ICD-10-CM | POA: Diagnosis not present

## 2022-01-30 DIAGNOSIS — M9903 Segmental and somatic dysfunction of lumbar region: Secondary | ICD-10-CM | POA: Diagnosis not present

## 2022-01-30 DIAGNOSIS — M9901 Segmental and somatic dysfunction of cervical region: Secondary | ICD-10-CM | POA: Diagnosis not present

## 2022-01-30 DIAGNOSIS — M546 Pain in thoracic spine: Secondary | ICD-10-CM | POA: Diagnosis not present

## 2022-01-30 DIAGNOSIS — M531 Cervicobrachial syndrome: Secondary | ICD-10-CM | POA: Diagnosis not present

## 2022-02-27 DIAGNOSIS — M545 Low back pain, unspecified: Secondary | ICD-10-CM | POA: Diagnosis not present

## 2022-02-27 DIAGNOSIS — M531 Cervicobrachial syndrome: Secondary | ICD-10-CM | POA: Diagnosis not present

## 2022-02-27 DIAGNOSIS — M9901 Segmental and somatic dysfunction of cervical region: Secondary | ICD-10-CM | POA: Diagnosis not present

## 2022-02-27 DIAGNOSIS — M9903 Segmental and somatic dysfunction of lumbar region: Secondary | ICD-10-CM | POA: Diagnosis not present

## 2022-02-27 DIAGNOSIS — M546 Pain in thoracic spine: Secondary | ICD-10-CM | POA: Diagnosis not present

## 2022-02-27 DIAGNOSIS — M9902 Segmental and somatic dysfunction of thoracic region: Secondary | ICD-10-CM | POA: Diagnosis not present

## 2022-03-27 DIAGNOSIS — M531 Cervicobrachial syndrome: Secondary | ICD-10-CM | POA: Diagnosis not present

## 2022-03-27 DIAGNOSIS — M545 Low back pain, unspecified: Secondary | ICD-10-CM | POA: Diagnosis not present

## 2022-03-27 DIAGNOSIS — M9903 Segmental and somatic dysfunction of lumbar region: Secondary | ICD-10-CM | POA: Diagnosis not present

## 2022-03-27 DIAGNOSIS — M546 Pain in thoracic spine: Secondary | ICD-10-CM | POA: Diagnosis not present

## 2022-03-27 DIAGNOSIS — M9901 Segmental and somatic dysfunction of cervical region: Secondary | ICD-10-CM | POA: Diagnosis not present

## 2022-03-27 DIAGNOSIS — M9902 Segmental and somatic dysfunction of thoracic region: Secondary | ICD-10-CM | POA: Diagnosis not present

## 2022-05-10 DIAGNOSIS — M546 Pain in thoracic spine: Secondary | ICD-10-CM | POA: Diagnosis not present

## 2022-05-10 DIAGNOSIS — M9903 Segmental and somatic dysfunction of lumbar region: Secondary | ICD-10-CM | POA: Diagnosis not present

## 2022-05-10 DIAGNOSIS — M9902 Segmental and somatic dysfunction of thoracic region: Secondary | ICD-10-CM | POA: Diagnosis not present

## 2022-05-10 DIAGNOSIS — M545 Low back pain, unspecified: Secondary | ICD-10-CM | POA: Diagnosis not present

## 2022-05-10 DIAGNOSIS — M9901 Segmental and somatic dysfunction of cervical region: Secondary | ICD-10-CM | POA: Diagnosis not present

## 2022-05-10 DIAGNOSIS — M531 Cervicobrachial syndrome: Secondary | ICD-10-CM | POA: Diagnosis not present

## 2022-07-03 ENCOUNTER — Encounter: Payer: Medicare HMO | Admitting: Dermatology

## 2022-10-30 ENCOUNTER — Other Ambulatory Visit: Payer: Self-pay | Admitting: Obstetrics and Gynecology

## 2022-10-30 DIAGNOSIS — Z1231 Encounter for screening mammogram for malignant neoplasm of breast: Secondary | ICD-10-CM

## 2022-11-20 ENCOUNTER — Ambulatory Visit
Admission: RE | Admit: 2022-11-20 | Discharge: 2022-11-20 | Disposition: A | Discharge status: Home / Self Care | Payer: Medicare HMO | Source: Ambulatory Visit | Attending: Obstetrics and Gynecology | Admitting: Obstetrics and Gynecology

## 2022-11-20 DIAGNOSIS — Z1231 Encounter for screening mammogram for malignant neoplasm of breast: Secondary | ICD-10-CM | POA: Insufficient documentation

## 2022-12-25 ENCOUNTER — Ambulatory Visit: Payer: Medicare HMO | Admitting: Dermatology

## 2022-12-25 VITALS — BP 154/88

## 2022-12-25 DIAGNOSIS — L259 Unspecified contact dermatitis, unspecified cause: Secondary | ICD-10-CM | POA: Diagnosis not present

## 2022-12-25 DIAGNOSIS — Z79899 Other long term (current) drug therapy: Secondary | ICD-10-CM

## 2022-12-25 DIAGNOSIS — L820 Inflamed seborrheic keratosis: Secondary | ICD-10-CM | POA: Diagnosis not present

## 2022-12-25 DIAGNOSIS — Z8619 Personal history of other infectious and parasitic diseases: Secondary | ICD-10-CM

## 2022-12-25 DIAGNOSIS — Z7189 Other specified counseling: Secondary | ICD-10-CM

## 2022-12-25 DIAGNOSIS — B009 Herpesviral infection, unspecified: Secondary | ICD-10-CM

## 2022-12-25 MED ORDER — ACYCLOVIR 5 % EX CREA: 1.0000 | TOPICAL_CREAM | Freq: Every day | CUTANEOUS | 11 refills | Status: AC

## 2022-12-25 MED ORDER — PIMECROLIMUS 1 % EX CREA: TOPICAL_CREAM | Freq: Two times a day (BID) | CUTANEOUS | 2 refills | Status: DC

## 2022-12-25 MED ORDER — VALACYCLOVIR HCL 1 G PO TABS: 1000.0000 mg | ORAL_TABLET | ORAL | 11 refills | Status: DC

## 2022-12-25 NOTE — Patient Instructions (Signed)

## 2022-12-25 NOTE — Progress Notes (Unsigned)
   Follow-Up Visit   Subjective  Patricia Clay is a 77 y.o. female who presents for the following: Spot of forehead x a couple months that hurts when she puts lotion on it. Lotion makes it dry and itchy. She has been using 2.5% hydrocortisone cream for about a weeks. The patient has spots, moles and lesions to be evaluated, some may be new or changing and the patient may have concern these could be cancer.  The following portions of the chart were reviewed this encounter and updated as appropriate: medications, allergies, medical history  Review of Systems:  No other skin or systemic complaints except as noted in HPI or Assessment and Plan.  Objective  Well appearing patient in no apparent distress; mood and affect are within normal limits. A focused examination was performed of the following areas: Face Relevant exam findings are noted in the Assessment and Plan.  Left Forehead Erythematous stuck-on, waxy papule or plaque   Assessment & Plan   Dermatitis - Contact dermatitis / eyelid dermatits Exam: Scaly pink papules coalescing to plaques  Treatment Plan: Elidel cream bid  May consider patch testing in the future if condition persists  Opzelura cream sample given today   Inflamed seborrheic keratosis Left Forehead  Symptomatic, irritating, patient would like treated.  Benign-appearing.  Call clinic for new or changing lesions.    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  It is infectious and can be spread from person to person by direct contact.  It is not curable, but is treatable with topical and oral medication. Treatment Plan: Valacyclovir 1g 2 po with first symptoms then 2 po 12 hours later  Acyclovir cream 5 times per day for 5 days for each outbreak  Return if symptoms worsen or fail to improve.  I, Joanie Coddington, CMA, am acting as scribe for Armida Sans, MD .  Documentation: I have reviewed the above documentation for accuracy and completeness, and I agree with the above.  Armida Sans, MD

## 2023-01-02 ENCOUNTER — Encounter: Payer: Self-pay | Admitting: Dermatology

## 2023-08-13 ENCOUNTER — Ambulatory Visit: Payer: Medicare Other | Admitting: Dermatology

## 2023-08-13 DIAGNOSIS — L720 Epidermal cyst: Secondary | ICD-10-CM

## 2023-08-13 DIAGNOSIS — D2371 Other benign neoplasm of skin of right lower limb, including hip: Secondary | ICD-10-CM | POA: Diagnosis not present

## 2023-08-13 DIAGNOSIS — D492 Neoplasm of unspecified behavior of bone, soft tissue, and skin: Secondary | ICD-10-CM

## 2023-08-13 DIAGNOSIS — L729 Follicular cyst of the skin and subcutaneous tissue, unspecified: Secondary | ICD-10-CM

## 2023-08-13 DIAGNOSIS — D229 Melanocytic nevi, unspecified: Secondary | ICD-10-CM

## 2023-08-13 DIAGNOSIS — Z1283 Encounter for screening for malignant neoplasm of skin: Secondary | ICD-10-CM | POA: Diagnosis not present

## 2023-08-13 DIAGNOSIS — H01134 Eczematous dermatitis of left upper eyelid: Secondary | ICD-10-CM

## 2023-08-13 DIAGNOSIS — D1801 Hemangioma of skin and subcutaneous tissue: Secondary | ICD-10-CM

## 2023-08-13 DIAGNOSIS — D2272 Melanocytic nevi of left lower limb, including hip: Secondary | ICD-10-CM

## 2023-08-13 DIAGNOSIS — L814 Other melanin hyperpigmentation: Secondary | ICD-10-CM

## 2023-08-13 DIAGNOSIS — L578 Other skin changes due to chronic exposure to nonionizing radiation: Secondary | ICD-10-CM | POA: Diagnosis not present

## 2023-08-13 DIAGNOSIS — W908XXA Exposure to other nonionizing radiation, initial encounter: Secondary | ICD-10-CM

## 2023-08-13 DIAGNOSIS — L821 Other seborrheic keratosis: Secondary | ICD-10-CM

## 2023-08-13 DIAGNOSIS — H01136 Eczematous dermatitis of left eye, unspecified eyelid: Secondary | ICD-10-CM

## 2023-08-13 DIAGNOSIS — Z86018 Personal history of other benign neoplasm: Secondary | ICD-10-CM

## 2023-08-13 MED ORDER — TACROLIMUS 0.1 % EX OINT: TOPICAL_OINTMENT | CUTANEOUS | 2 refills | Status: AC

## 2023-08-13 NOTE — Progress Notes (Signed)
 Follow-Up Visit   Subjective  Patricia Clay is a 78 y.o. female who presents for the following: Skin Cancer Screening and Full Body Skin Exam check L upper eyelid, red dry and irritated, Eucrisa ointment burns.  The patient presents for Total-Body Skin Exam (TBSE) for skin cancer screening and mole check. The patient has spots, moles and lesions to be evaluated, some may be new or changing and the patient may have concern these could be cancer.    The following portions of the chart were reviewed this encounter and updated as appropriate: medications, allergies, medical history  Review of Systems:  No other skin or systemic complaints except as noted in HPI or Assessment and Plan.  Objective  Well appearing patient in no apparent distress; mood and affect are within normal limits.  A full examination was performed including scalp, head, eyes, ears, nose, lips, neck, chest, axillae, abdomen, back, buttocks, bilateral upper extremities, bilateral lower extremities, hands, feet, fingers, toes, fingernails, and toenails. All findings within normal limits unless otherwise noted below.   Relevant physical exam findings are noted in the Assessment and Plan.  R lat lower pretibia 5.24mm speckled brown pap   Assessment & Plan   SKIN CANCER SCREENING PERFORMED TODAY.  ACTINIC DAMAGE - Chronic condition, secondary to cumulative UV/sun exposure - diffuse scaly erythematous macules with underlying dyspigmentation - Recommend daily broad spectrum sunscreen SPF 30+ to sun-exposed areas, reapply every 2 hours as needed.  - Staying in the shade or wearing long sleeves, sun glasses (UVA+UVB protection) and wide brim hats (4-inch brim around the entire circumference of the hat) are also recommended for sun protection.  - Call for new or changing lesions.  LENTIGINES, SEBORRHEIC KERATOSES, HEMANGIOMAS - Benign normal skin lesions - Benign-appearing - Call for any changes  MELANOCYTIC NEVI -  Tan-brown and/or pink-flesh-colored symmetric macules and papules - L medial calf 5.87mm speckled brown macule triangular shaped with tail, stable  - Benign appearing on exam today, Stable compared to previous visit. - Observation - Call clinic for new or changing moles - Recommend daily use of broad spectrum spf 30+ sunscreen to sun-exposed areas.    SEBORRHEIC KERATOSIS R thenar hand dorsum Exam: 7.67mm triangular shaped waxy brown macule R thenar hand dorsum  Treatment: Benign appearing, stable, observe  EYELID DERMATITIS, possibly atopic Lupper eyelid Exam: Pink scaly patch L medial upper eyelid  Treatment Plan: Start Opzelura cream qd to eyelid  prn flares, sample x 1 Lot [id number] exp 07/19/24 Start Tacrolimus ointment 0.1% qd/bid aa face prn eczema flares  EPIDERMAL INCLUSION CYST L upper back Exam: Subcutaneous nodule at L upper back 1.0cm  Benign-appearing. Exam most consistent with an epidermal inclusion cyst. Discussed that a cyst is a benign growth that can grow over time and sometimes get irritated or inflamed. Recommend observation if it is not bothersome. Discussed option of surgical excision to remove it if it is growing, symptomatic, or other changes noted. Please call for new or changing lesions so they can be evaluated.  HISTORY OF ATYPICAL NEVUS No evidence of recurrence today- R lower leg Recommend regular full body skin exams Recommend daily broad spectrum sunscreen SPF 30+ to sun-exposed areas, reapply every 2 hours as needed.  Call if any new or changing lesions are noted between office visits     NEOPLASM OF SKIN R lat lower pretibia Epidermal / dermal shaving  Lesion diameter (cm):  0.6 Informed consent: discussed and consent obtained   Patient was prepped and draped  in usual sterile fashion: area prepped with alcohol. Anesthesia: the lesion was anesthetized in a standard fashion   Anesthetic:  1% lidocaine w/ epinephrine 1-100,000 buffered w/ 8.4%  NaHCO3 Instrument used: flexible razor blade   Hemostasis achieved with: pressure, aluminum chloride and electrodesiccation   Outcome: patient tolerated procedure well   Post-procedure details: wound care instructions given   Post-procedure details comment:  Ointment and small bandage applied Specimen 1 - Surgical pathology Differential Diagnosis: D48.5 Nevus vs Dermatofibroma r/o Atypia  Check Margins: yes 5.49mm speckled brown pap Return in about 1 year (around 08/12/2024) for TBSE, Hx of Dysplastic nevi.  I, Ardis Rowan, RMA, am acting as scribe for Willeen Niece, MD .   Documentation: I have reviewed the above documentation for accuracy and completeness, and I agree with the above.  Willeen Niece, MD

## 2023-08-13 NOTE — Patient Instructions (Addendum)
 Wound Care Instructions  Cleanse wound gently with soap and water once a day then pat dry with clean gauze. Apply a thin coat of Petrolatum (petroleum jelly, "Vaseline") over the wound (unless you have an allergy to this). We recommend that you use a new, sterile tube of Vaseline. Do not pick or remove scabs. Do not remove the yellow or white "healing tissue" from the base of the wound.  Cover the wound with fresh, clean, nonstick gauze and secure with paper tape. You may use Band-Aids in place of gauze and tape if the wound is small enough, but would recommend trimming much of the tape off as there is often too much. Sometimes Band-Aids can irritate the skin.  You should call the office for your biopsy report after 1 week if you have not already been contacted.  If you experience any problems, such as abnormal amounts of bleeding, swelling, significant bruising, significant pain, or evidence of infection, please call the office immediately.  FOR ADULT SURGERY PATIENTS: If you need something for pain relief you may take 1 extra strength Tylenol (acetaminophen) AND 2 Ibuprofen (200mg  each) together every 4 hours as needed for pain. (do not take these if you are allergic to them or if you have a reason you should not take them.) Typically, you may only need pain medication for 1 to 3 days.   Opzelura cream once daily to eyelid as needed for dermatitis   Due to recent changes in healthcare laws, you may see results of your pathology and/or laboratory studies on MyChart before the doctors have had a chance to review them. We understand that in some cases there may be results that are confusing or concerning to you. Please understand that not all results are received at the same time and often the doctors may need to interpret multiple results in order to provide you with the best plan of care or course of treatment. Therefore, we ask that you please give [place] 2 business days to thoroughly review all your  results before contacting the office for clarification. Should we see a critical lab result, you will be contacted sooner.   If You Need Anything After Your Visit  If you have any questions or concerns for your doctor, please call our main line at [phone] and press option 4 to reach your doctor's medical assistant. If no one answers, please leave a voicemail as directed and we will return your call as soon as possible. Messages left after 4 pm will be answered the following business day.   You may also send [place] a message via MyChart. We typically respond to MyChart messages within 1-2 business days.  For prescription refills, please ask your pharmacy to contact our office. Our fax number is [phone].  If you have an urgent issue when the clinic is closed that cannot wait until the next business day, you can page your doctor at the number below.    Please note that while we do our best to be available for urgent issues outside of office hours, we are not available 24/7.   If you have an urgent issue and are unable to reach [place], you may choose to seek medical care at your doctor's office, retail clinic, urgent care center, or emergency room.  If you have a medical emergency, please immediately call 911 or go to the emergency department.  Pager Numbers  - Dr. Gwen Pounds: [phone]  - Dr. Roseanne Reno: [phone]  - Dr. Katrinka Blazing: [phone]   In  the event of inclement weather, please call our main line at [phone] for an update on the status of any delays or closures.  Dermatology Medication Tips: Please keep the boxes that topical medications come in in order to help keep track of the instructions about where and how to use these. Pharmacies typically print the medication instructions only on the boxes and not directly on the medication tubes.   If your medication is too expensive, please contact our office at [phone] option 4 or send [place] a message through MyChart.   We are  unable to tell what your co-pay for medications will be in advance as this is different depending on your insurance coverage. However, we may be able to find a substitute medication at lower cost or fill out paperwork to get insurance to cover a needed medication.   If a prior authorization is required to get your medication covered by your insurance company, please allow [place] 1-2 business days to complete this process.  Drug prices often vary depending on where the prescription is filled and some pharmacies may offer cheaper prices.  The website www.goodrx.com contains coupons for medications through different pharmacies. The prices here do not account for what the cost may be with help from insurance (it may be cheaper with your insurance), but the website can give you the price if you did not use any insurance.  - You can print the associated coupon and take it with your prescription to the pharmacy.  - You may also stop by our office during regular business hours and pick up a GoodRx coupon card.  - If you need your prescription sent electronically to a different pharmacy, notify our office through [facility] or by phone at [phone] option 4.     Si Usted Necesita Algo Despus de Su Visita  Tambin puede enviarnos un mensaje a travs de Clinical cytogeneticist. Por lo general respondemos a los mensajes de MyChart en el transcurso de 1 a 2 das hbiles.  Para renovar recetas, por favor pida a su farmacia que se ponga en contacto con nuestra oficina. Annie Sable de fax es [place] [phone].  Si tiene un asunto urgente cuando la clnica est cerrada y que no puede esperar hasta el siguiente da hbil, puede llamar/localizar a su doctor(a) al nmero que aparece a continuacin.   Por favor, tenga en cuenta que aunque hacemos todo lo posible para estar disponibles para asuntos urgentes fuera del horario de [place], no estamos disponibles las 24 horas del da, los 7 [address]  [address] de la [place].   Si tiene un  problema urgente y no puede comunicarse con nosotros, puede optar por buscar atencin mdica  en el consultorio de su doctor(a), en una clnica privada, en un centro de atencin urgente o en una sala de emergencias.  Si tiene Engineer, drilling, por favor llame inmediatamente al 911 o vaya a la sala de emergencias.  Nmeros de bper  - Dr. Gwen Pounds: [phone]  - Dra. Roseanne Reno: [phone]  - Dr. Katrinka Blazing: [phone]   En caso de inclemencias del tiempo, por favor llame a Lacy Duverney principal al [phone] para una actualizacin sobre el [place] de cualquier retraso o cierre.  Consejos para la medicacin en dermatologa: Por favor, guarde las cajas en las que vienen los medicamentos de uso tpico para ayudarle a seguir las instrucciones sobre dnde y cmo usarlos. Las farmacias generalmente imprimen las instrucciones del medicamento slo en las cajas y no directamente en los tubos del [place].  Si su medicamento es muy caro, por favor, pngase en contacto con Rolm Gala llamando al [phone] y presione la opcin 4 o envenos un mensaje a travs de Clinical cytogeneticist.   No podemos decirle cul ser su copago por los medicamentos por adelantado ya que esto es diferente dependiendo de la cobertura de su seguro. Sin embargo, es posible que podamos encontrar un medicamento sustituto a Audiological scientist un formulario para que el seguro cubra el medicamento que se considera necesario.   Si se requiere una autorizacin previa para que su compaa de seguros [place] su medicamento, por favor permtanos de 1 a 2 das hbiles para completar [address]  Los precios de los medicamentos varan con frecuencia dependiendo del Environmental consultant de dnde se surte la receta y alguna farmacias pueden ofrecer precios ms baratos.  El sitio web www.goodrx.com tiene cupones para medicamentos de Health and safety inspector. Los precios aqu no tienen en cuenta lo que podra costar con la ayuda del seguro (puede ser ms  barato con su seguro), pero el sitio web puede darle el precio si no utiliz Tourist information centre manager.  - Puede imprimir el cupn correspondiente y llevarlo con su receta a la farmacia.  - Tambin puede pasar por nuestra oficina durante el horario de atencin regular y Education officer, museum una tarjeta de cupones de GoodRx.  - Si necesita que su receta se enve electrnicamente a una farmacia diferente, informe a nuestra oficina a travs de MyChart de [place] o por telfono llamando al [phone] y presione la opcin 4.

## 2023-08-14 LAB — SURGICAL PATHOLOGY

## 2023-08-15 ENCOUNTER — Telehealth: Payer: Self-pay

## 2023-08-15 NOTE — Telephone Encounter (Signed)
-----   Message from Willeen Niece sent at 08/14/2023  6:58 PM EDT ----- 1. Skin, R lat lower pretibia :       SURFACE OF A DERMATOFIBROMA   Benign - please call patient

## 2023-08-15 NOTE — Telephone Encounter (Signed)
 Advised pt of bx result/sh ?

## 2023-10-26 ENCOUNTER — Other Ambulatory Visit: Payer: Self-pay | Admitting: Internal Medicine

## 2023-10-26 DIAGNOSIS — Z1231 Encounter for screening mammogram for malignant neoplasm of breast: Secondary | ICD-10-CM

## 2023-12-17 ENCOUNTER — Ambulatory Visit
Admission: RE | Admit: 2023-12-17 | Discharge: 2023-12-17 | Disposition: A | Discharge status: Home / Self Care | Source: Ambulatory Visit | Attending: Internal Medicine | Admitting: Internal Medicine

## 2023-12-17 DIAGNOSIS — Z1231 Encounter for screening mammogram for malignant neoplasm of breast: Secondary | ICD-10-CM | POA: Diagnosis present

## 2024-02-07 ENCOUNTER — Other Ambulatory Visit: Payer: Self-pay | Admitting: Dermatology

## 2024-09-01 ENCOUNTER — Ambulatory Visit: Admitting: Dermatology

## 2024-09-02 ENCOUNTER — Encounter: Admitting: Dermatology
